# Patient Record
Sex: Female | Born: 1950 | Race: White | Hispanic: No | Marital: Married | Smoking: Never smoker
Health system: Southern US, Community
[De-identification: ages and names within clinical notes are randomized; demographics above are authoritative.]

## PROBLEM LIST (undated history)

## (undated) HISTORY — PX: TUBAL LIGATION: SHX77

## (undated) HISTORY — PX: APPENDECTOMY: SHX54

## (undated) HISTORY — PX: ELBOW SURGERY: SHX618

---

## 2009-06-23 ENCOUNTER — Ambulatory Visit: Payer: Self-pay | Admitting: Unknown Physician Specialty

## 2009-06-29 ENCOUNTER — Ambulatory Visit: Payer: Self-pay | Admitting: Unknown Physician Specialty

## 2010-09-12 ENCOUNTER — Emergency Department: Payer: Self-pay | Admitting: Emergency Medicine

## 2011-05-30 ENCOUNTER — Ambulatory Visit: Payer: Self-pay | Admitting: Family Medicine

## 2014-03-16 ENCOUNTER — Ambulatory Visit: Payer: Self-pay | Admitting: Family Medicine

## 2014-05-09 ENCOUNTER — Ambulatory Visit: Payer: Self-pay | Admitting: Family Medicine

## 2015-04-15 IMAGING — CR DG CHEST 2V
1 series · 2 of 2 positions shown · non-contrast
Comparison: None.

CLINICAL DATA: dry cough since [REDACTED];

EXAM:
CHEST  2 VIEW

[Series 1: pa · 0.17mm/px · 2 of 2 slices shown]
[im 1/2]
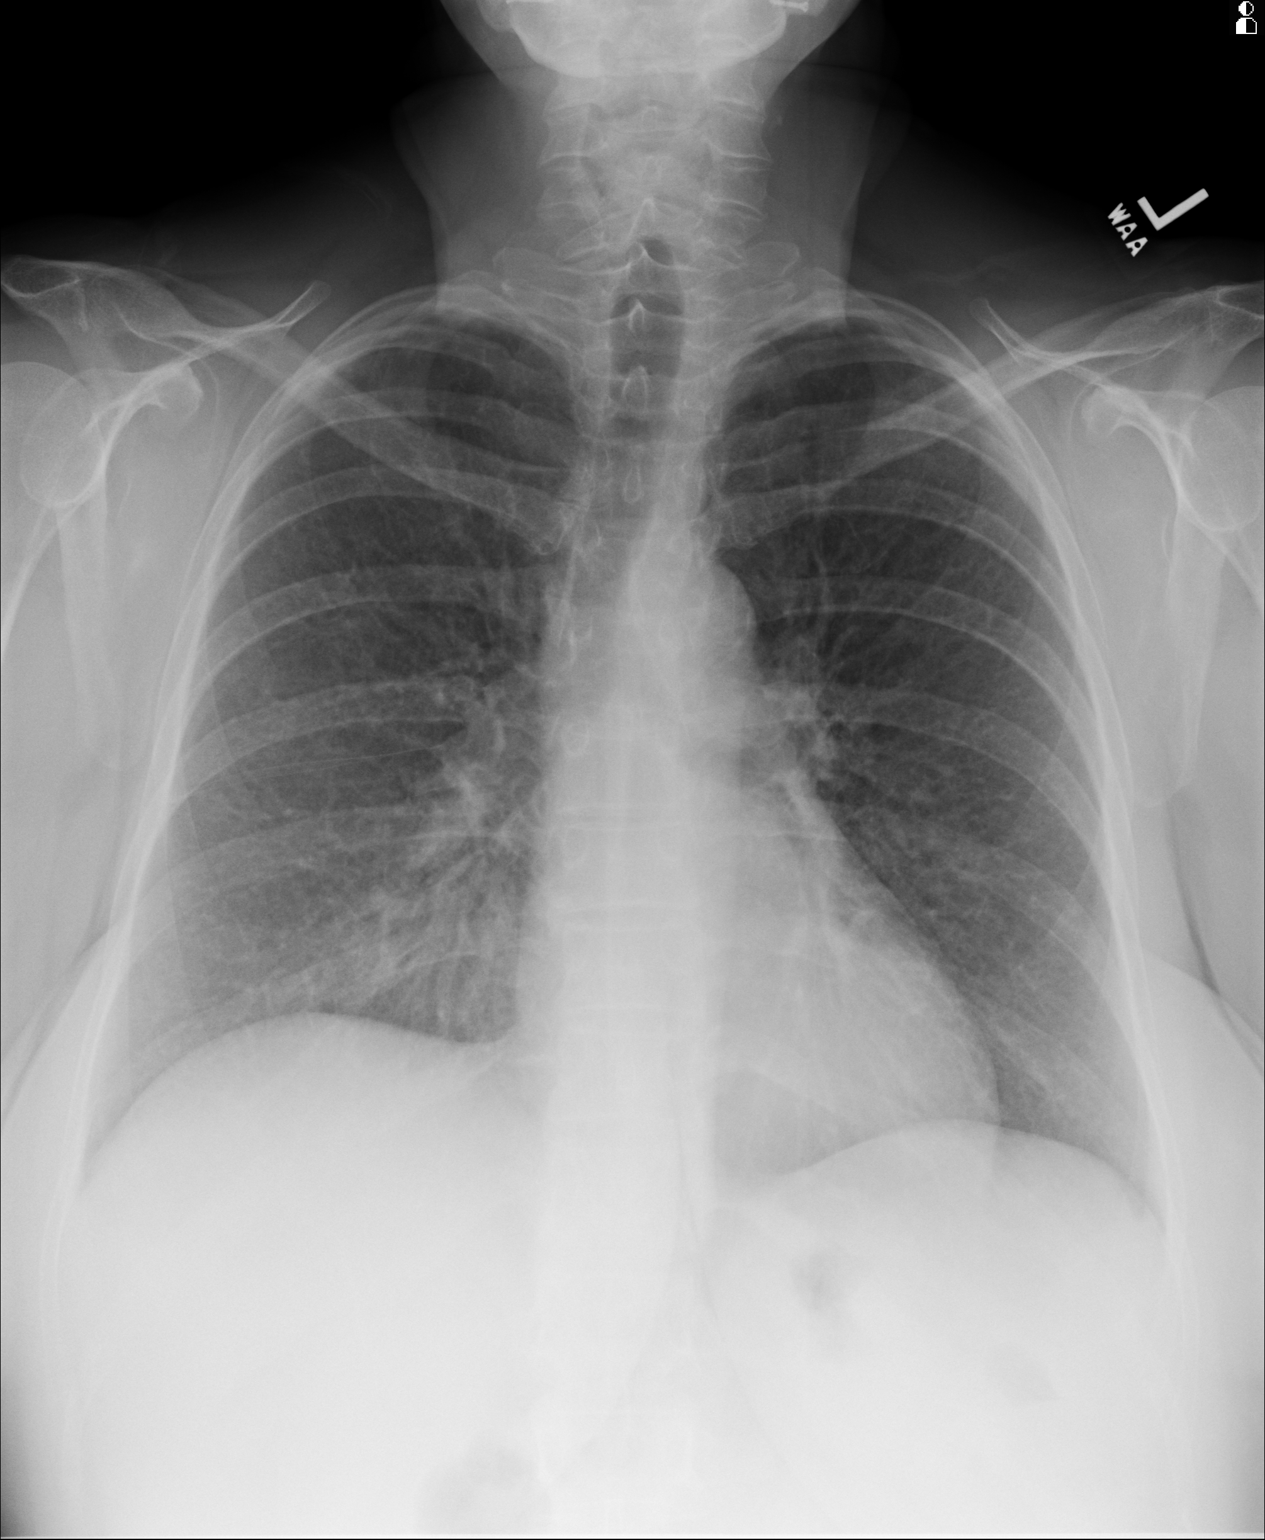
[im 2/2]
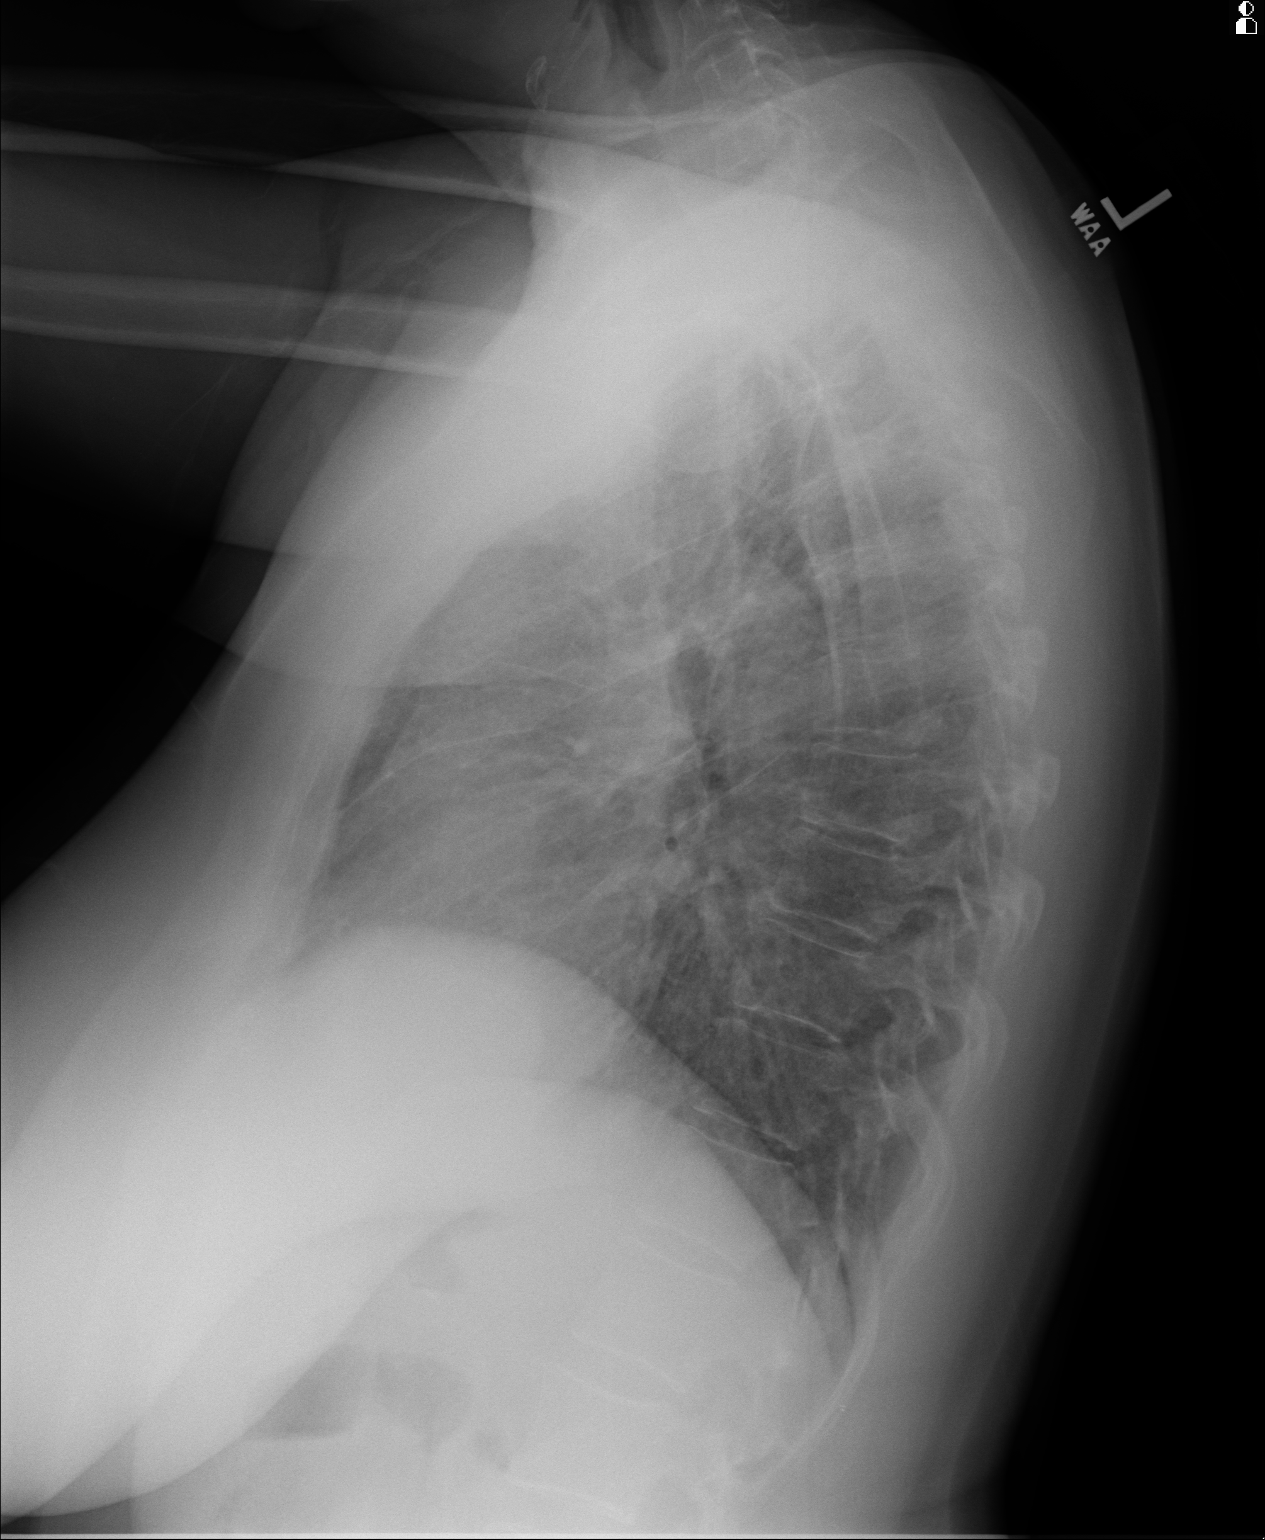

[2 of 2 positions shown; findings below may reference images not displayed]

FINDINGS: The cardiac silhouette within normal limits. Atherosclerotic
calcifications identified within arch of the aorta. No focal regions
of consolidation or focal infiltrates appreciated. There is diffuse
mild prominence of the interstitial markings without evidence of
peribronchial cuffing. No acute osseous abnormalities.
IMPRESSION: Mild interstitial prominence may reflect sequela of an infectious or
inflammatory infiltrate. Repeat surveillance evaluation status post
appropriate therapy regimen is recommended.

## 2015-07-31 ENCOUNTER — Telehealth: Payer: Self-pay | Admitting: Family Medicine

## 2015-07-31 DIAGNOSIS — E039 Hypothyroidism, unspecified: Secondary | ICD-10-CM

## 2015-07-31 NOTE — Telephone Encounter (Signed)
Pt said Dr. Juanetta Gosling changed her thyroid medication and wanted to recheck her lab work.  She would like to come by pick up a lab order.  Please call when ready to pick up [phone]

## 2015-07-31 NOTE — Telephone Encounter (Signed)
Done/JH 

## 2015-08-02 LAB — TSH: TSH: 2.77 u[IU]/mL (ref 0.450–4.500)

## 2015-11-05 ENCOUNTER — Other Ambulatory Visit: Payer: Self-pay | Admitting: Family Medicine

## 2016-06-03 ENCOUNTER — Encounter: Payer: Self-pay | Admitting: Family Medicine

## 2016-06-28 ENCOUNTER — Encounter: Payer: Self-pay | Admitting: Family Medicine

## 2016-07-08 ENCOUNTER — Encounter: Payer: Self-pay | Admitting: Family Medicine

## 2016-07-08 ENCOUNTER — Ambulatory Visit (INDEPENDENT_AMBULATORY_CARE_PROVIDER_SITE_OTHER): Payer: BC Managed Care – PPO | Admitting: Family Medicine

## 2016-07-08 VITALS — BP 115/75 | HR 91 | Temp 97.8°F | Resp 16 | Ht 63.0 in | Wt 167.0 lb

## 2016-07-08 DIAGNOSIS — E038 Other specified hypothyroidism: Secondary | ICD-10-CM

## 2016-07-08 DIAGNOSIS — E034 Atrophy of thyroid (acquired): Secondary | ICD-10-CM

## 2016-07-08 DIAGNOSIS — K219 Gastro-esophageal reflux disease without esophagitis: Secondary | ICD-10-CM | POA: Insufficient documentation

## 2016-07-08 DIAGNOSIS — E039 Hypothyroidism, unspecified: Secondary | ICD-10-CM | POA: Insufficient documentation

## 2016-07-08 MED ORDER — LEVOTHYROXINE SODIUM 50 MCG PO TABS: 50.0000 ug | ORAL_TABLET | Freq: Every day | ORAL | Status: DC

## 2016-07-08 MED ORDER — OMEPRAZOLE 20 MG PO CPDR: 20.0000 mg | DELAYED_RELEASE_CAPSULE | Freq: Every day | ORAL | Status: DC

## 2016-07-08 NOTE — Progress Notes (Signed)
Name: Randall AnVicky V Nieland   MRN: [id number]    DOB: 20-Apr-1951   Date:07/08/2016       Progress Note  Subjective  Chief Complaint  Chief Complaint  Patient presents with  . Annual Exam    HPI Patient will reschedule for Medi care Wellness Exam in August  No problem-specific assessment & plan notes found for this encounter.   History reviewed. No pertinent past medical history.  Past Surgical History  Procedure Laterality Date  . Appendectomy    . Tubal ligation      then untide  . Elbow surgery Left     Family History  Problem Relation Age of Onset  . Heart disease Father   . Kidney disease Father     Social History   Social History  . Marital Status: Married    Spouse Name: N/A  . Number of Children: N/A  . Years of Education: N/A   Occupational History  . Not on file.   Social History Main Topics  . Smoking status: Never Smoker   . Smokeless tobacco: Never Used  . Alcohol Use: No  . Drug Use: No  . Sexual Activity: Not on file   Other Topics Concern  . Not on file   Social History Narrative  . No narrative on file     Current outpatient prescriptions:  .  levothyroxine (SYNTHROID, LEVOTHROID) 50 MCG tablet, TAKE 1 TABLET DAILY, Disp: 90 tablet, Rfl: 1  Allergies  Allergen Reactions  . Bee Venom Swelling  . Flagyl [Metronidazole] Other (See Comments)    Loss of motor skills  . Penicillins   . Tape Hives     Review of Systems  Unable to perform ROS     Objective  Filed Vitals:   07/08/16 1407  BP: 115/75  Pulse: 91  Temp: 97.8 F (36.6 C)  TempSrc: Oral  Resp: 16  Height: 5\' 3"  (1.6 m)  Weight: 167 lb (75.751 kg)    Physical Exam     No results found for this or any previous visit (from the past 2160 hour(s)).   Assessment & Plan  Problem List Items Addressed This Visit      Digestive   GERD (gastroesophageal reflux disease)     Endocrine   Hypothyroidism - Primary      No orders of the defined types were placed  in this encounter.       c

## 2016-07-09 LAB — TSH: TSH: 2.86 m[IU]/L

## 2016-10-16 ENCOUNTER — Encounter: Payer: Self-pay | Admitting: Family Medicine

## 2016-10-16 ENCOUNTER — Ambulatory Visit (INDEPENDENT_AMBULATORY_CARE_PROVIDER_SITE_OTHER): Payer: Medicare Other | Admitting: Family Medicine

## 2016-10-16 ENCOUNTER — Encounter (INDEPENDENT_AMBULATORY_CARE_PROVIDER_SITE_OTHER): Payer: Self-pay

## 2016-10-16 VITALS — BP 134/88 | HR 86 | Temp 97.8°F | Resp 16 | Ht 63.5 in | Wt 172.0 lb

## 2016-10-16 DIAGNOSIS — B372 Candidiasis of skin and nail: Secondary | ICD-10-CM | POA: Diagnosis not present

## 2016-10-16 DIAGNOSIS — L304 Erythema intertrigo: Secondary | ICD-10-CM

## 2016-10-16 MED ORDER — HYDROXYZINE HCL 10 MG PO TABS: 5.0000 mg | ORAL_TABLET | Freq: Three times a day (TID) | ORAL | 0 refills | Status: DC | PRN

## 2016-10-16 MED ORDER — TRIAMCINOLONE ACETONIDE 0.1 % EX OINT: 1.0000 "application " | TOPICAL_OINTMENT | Freq: Two times a day (BID) | CUTANEOUS | 0 refills | Status: DC

## 2016-10-16 MED ORDER — NYSTATIN 100000 UNIT/GM EX POWD: Freq: Three times a day (TID) | CUTANEOUS | 1 refills | Status: DC

## 2016-10-16 NOTE — Assessment & Plan Note (Signed)
Subacute flare x 1 month bilateral under breast skin folds, moderate appearing extensive candidal intertrigo characteristic on exam. Prior similar episodes years ago due to excessive sweating / moisture.  Plan: 1. Start Nystatin powder TID for 2-4 weeks 2. Also given rx Triamcinolone 0.1% ointment 1-2x daily for up to 2 weeks (may use in few days to reduce inflammation if needed) 3. Hydroxyzine 5-10mg  TID PRN itching, stop benadryl 4. Keep dry, open to air some, limit bra, avoid excessive sweating 5. Follow-up 2-3 weeks if not improved, consider alternative anti fungal topical

## 2016-10-16 NOTE — Patient Instructions (Signed)
Thank you for coming in to clinic today.  1. Use Nystatin powder 3 times a day for up to 2 to 4 weeks, if significant improvement after 2 weeks, you can reduce to twice a day for another 1-2 weeks. - Redness may take longer to resolve - Also use Triamcinoline after next few days, 1 to 2 times a day to help reduce inflammation, use up to 2 weeks - Keep dry as best you can, air dry is best, avoid excess walking and situations with sweating for now  If you are not improving, worsening pain redness,  Itching, spreading, then please notify us after 1 week and we can try an alternative form of anti yeast topical, such as a cream, and then still if not improving last resort is an oral pill that will only be if not improving after 2-3 weeks.  Please schedule a follow-up appointment with Dr. Althea CharonKaramalegos in 2 to 3 weeks if Rash not improved  If you have any other questions or concerns, please feel free to call the clinic or send a message through MyChart. You may also schedule an earlier appointment if necessary.  Saralyn PilarAlexander Arthelia Callicott, DO [facility]

## 2016-10-16 NOTE — Progress Notes (Signed)
Subjective:    Patient ID: Natalie Kidd, female    DOB: 1951/05/03, 65 y.o.   MRN: [id number]  Natalie Kidd is a 65 y.o. female presenting on 10/16/2016 for Rash (onset month bothside underneath breast area)  Patient presents for a same day appointment.  HPI   RASH, INTERTRIGO - History of prior rashes under breasts on torso diagnosed as yeast rash in past, last was several years ago, has resolved with topical treatments, but she is unsure which have been used before - Reports current flare started 1 month ago, similar to prior rashes, describes onset due to increased sweating with humidity recently, she walks regularly. Describes red irritated skin worsening under both breasts, symptoms with some burning and now worsening itching, tried Benadryl but makes her too sleepy - Tried OTC Neosporin, Cortizone, Corn starch, baby powder without relief, did not try medicine for yeast infection - Denies any fevers/chills, nausea, vomiting, abdominal pain, drainage of pus, other rash, skin pain  Social History  Substance Use Topics  . Smoking status: Never Smoker  . Smokeless tobacco: Never Used  . Alcohol use No    Review of Systems Per HPI unless specifically indicated above     Objective:    BP 134/88   Pulse 86   Temp 97.8 F (36.6 C) (Oral)   Resp 16   Ht 5' 3.5" (1.613 m)   Wt 172 lb (78 kg)   BMI 29.99 kg/m   Wt Readings from Last 3 Encounters:  10/16/16 172 lb (78 kg)  07/08/16 167 lb (75.8 kg)    Physical Exam  Constitutional: She appears well-developed and well-nourished. No distress.  Well-appearing, comfortable, cooperative  Cardiovascular: Normal rate.   Pulmonary/Chest: Effort normal.  Neurological: She is alert.  Skin: Skin is warm and dry. Rash noted. She is not diaphoretic.  Torso, Under Bilateral Breasts - Significant areas of red macerated tissue isolated to under breasts bilaterally (R worse than Left) with some superficial skin sloughing centrally,  extensive satellite lesions scattered on periphery of well demarcated rash. No evidence of cellulitis or extending erythema, no drainage of pus. Sore but non tender.  No chaperone present during exam as patient remained mostly covered. Patient preference, chaperone was offered.  Nursing note and vitals reviewed.      Assessment & Plan:   Problem List Items Addressed This Visit    Candidal intertrigo - Primary    Subacute flare x 1 month bilateral under breast skin folds, moderate appearing extensive candidal intertrigo characteristic on exam. Prior similar episodes years ago due to excessive sweating / moisture.  Plan: 1. Start Nystatin powder TID for 2-4 weeks 2. Also given rx Triamcinolone 0.1% ointment 1-2x daily for up to 2 weeks (may use in few days to reduce inflammation if needed) 3. Hydroxyzine 5-10mg  TID PRN itching, stop benadryl 4. Keep dry, open to air some, limit bra, avoid excessive sweating 5. Follow-up 2-3 weeks if not improved, consider alternative anti fungal topical      Relevant Medications   nystatin (MYCOSTATIN/NYSTOP) powder   triamcinolone ointment (KENALOG) 0.1 %   hydrOXYzine (ATARAX/VISTARIL) 10 MG tablet    Other Visit Diagnoses   None.     Meds ordered this encounter  Medications  . nystatin (MYCOSTATIN/NYSTOP) powder    Sig: Apply topically 3 (three) times daily. For 2 to 4 weeks until healed.    Dispense:  15 g    Refill:  1  . triamcinolone ointment (KENALOG) 0.1 %  Sig: Apply 1 application topically 2 (two) times daily. For up to 2 weeks    Dispense:  30 g    Refill:  0  . hydrOXYzine (ATARAX/VISTARIL) 10 MG tablet    Sig: Take 0.5-1 tablets (5-10 mg total) by mouth 3 (three) times daily as needed for itching.    Dispense:  20 tablet    Refill:  0      Follow up plan: Return in about 3 weeks (around 11/06/2016), or if symptoms worsen or fail to improve, for rash not improving.  Saralyn Pilar, DO [facility] 10/16/2016, 11:52 AM

## 2017-07-07 ENCOUNTER — Ambulatory Visit (INDEPENDENT_AMBULATORY_CARE_PROVIDER_SITE_OTHER): Payer: BC Managed Care – PPO | Admitting: Nurse Practitioner

## 2017-07-07 ENCOUNTER — Encounter: Payer: Self-pay | Admitting: Nurse Practitioner

## 2017-07-07 VITALS — BP 147/66 | HR 87 | Temp 97.6°F | Resp 16 | Ht 63.0 in | Wt 180.6 lb

## 2017-07-07 DIAGNOSIS — R635 Abnormal weight gain: Secondary | ICD-10-CM | POA: Diagnosis not present

## 2017-07-07 DIAGNOSIS — B372 Candidiasis of skin and nail: Secondary | ICD-10-CM

## 2017-07-07 DIAGNOSIS — E039 Hypothyroidism, unspecified: Secondary | ICD-10-CM

## 2017-07-07 DIAGNOSIS — K219 Gastro-esophageal reflux disease without esophagitis: Secondary | ICD-10-CM | POA: Diagnosis not present

## 2017-07-07 DIAGNOSIS — Z6831 Body mass index (BMI) 31.0-31.9, adult: Secondary | ICD-10-CM | POA: Diagnosis not present

## 2017-07-07 DIAGNOSIS — L918 Other hypertrophic disorders of the skin: Secondary | ICD-10-CM | POA: Diagnosis not present

## 2017-07-07 MED ORDER — LEVOTHYROXINE SODIUM 50 MCG PO TABS: 50.0000 ug | ORAL_TABLET | Freq: Every day | ORAL | 1 refills | Status: DC

## 2017-07-07 MED ORDER — OMEPRAZOLE 20 MG PO CPDR: 20.0000 mg | DELAYED_RELEASE_CAPSULE | Freq: Every day | ORAL | 3 refills | Status: DC

## 2017-07-07 MED ORDER — TRIAMCINOLONE ACETONIDE 0.1 % EX OINT: 1.0000 "application " | TOPICAL_OINTMENT | Freq: Two times a day (BID) | CUTANEOUS | 0 refills | Status: DC

## 2017-07-07 NOTE — Progress Notes (Signed)
Subjective:    Patient ID: Natalie Kidd, female    DOB: 20-Jan-1951, 66 y.o.   MRN: [id number]  TAVI GAUGHRAN is a 66 y.o. female presenting on 07/07/2017 for Hypothyroidism   HPI Hypothyroidism Pt states she is taking her levothyroxine in the am at least 1 hour before eating or drinking and taking other medicines.  She deneis heart racing, heart palpitations, heat intolerance, changes in hair/skin/nails, and lower leg swelling. She does not have any compressive symptoms to include difficulty swallowing, globus sensation, or difficulty breathing when lying flat. - She is noting weight gain despite watching what she eats.  She eats only two "small" meals per day w/ lunch and dinner.   Exercises 30-60 minutes daily 4-5 times per week.  Does 8,000 steps per day w/ fitbit.   GERD Takes medication regularly and notices globus sensation if does not take this. Omeprazole.  Watches what she eats.  Small meals and early meals.  Breast irritation Pt notes recurrent irritation below her pendulous breasts.  She specifically notes difficulty keeping the skin dry and regularly uses cornstarch to absorb the moisture.  W/ irritation, she uses neosporin and triamcinolone and w/ cornstarch, she notes it will eventually heal.  Request for referral to dermatology: - Pt notes a "Knot on head" that has gotten larger and harder to hide. It has never been sore, swollen, or warm to touch.   - Skin tags located near her axilla: visibly unappealing and would like to have removed.  Social History  Substance Use Topics  . Smoking status: Never Smoker  . Smokeless tobacco: Never Used  . Alcohol use No    Review of Systems Per HPI unless specifically indicated above     Objective:    BP (!) 147/66   Pulse 87   Temp 97.6 F (36.4 C) (Oral)   Resp 16   Ht 5\' 3"  (1.6 m)   Wt 180 lb 9.6 oz (81.9 kg)   BMI 31.99 kg/m    Wt Readings from Last 3 Encounters:  07/07/17 180 lb 9.6 oz (81.9 kg)  10/16/16  172 lb (78 kg)  07/08/16 167 lb (75.8 kg)    Physical Exam General - healthy, well-appearing, NAD HEENT - Normocephalic, atraumatic, PERRL, EOMI, patent nares w/o congestion, oropharynx clear, MMM Neck - supple, non-tender, no LAD, mild thyromegaly w/o palpable nodules Heart - RRR, bradycardia, no murmurs heard. Lungs - Clear throughout all lobes, no wheezing, crackles, or rhonchi. Normal work of breathing. Abdomen - soft, NTND, no masses, no hepatosplenomegaly, active bowel sounds. Extremeties - non-tender, no edema, cap refill < 2 seconds, peripheral pulses intact +2 bilaterally Skin - warm, dry, small area about 1 inch of erythema noted under left lateral breast.  Other skin under breasts intact w/o lesions.  Skin tags under left axilla.  Small cyst-like structure on scalp over frontal bone left of midline w/o erythema or periwound edema. Neuro - awake, alert, oriented, intact muscle strength 5/5 bilaterally, intact distal sensation to light touch, normal coordination, normal gait Psych - Normal mood and affect, normal behavior    Results for orders placed or performed in visit on 07/07/17  TSH  Result Value Ref Range   TSH 2.050 0.450 - 4.500 uIU/mL      Assessment & Plan:   Problem List Items Addressed This Visit      Digestive   GERD (gastroesophageal reflux disease)    Symptoms currently well controlled on omeprazole.  Pt does note symptoms  if missed doses.  Plan: 1. Continue omeprazole 20 mg once daily. 2. Follow up 1 year or sooner if needed.      Relevant Medications   omeprazole (PRILOSEC) 20 MG capsule     Endocrine   Hypothyroidism - Primary    Controlled hypothyroidism w/ TSH in normal range and on stable dose of levothyroxine for > 1 year. Pt taking after vitamins and supplements, but has been taking this way for many months.  Aside from weight gain, no other symptoms of hypothyroidism present.  Plan: 1. Continue levothyroxine 50 mcg once daily.  May continue  same routine since TSH stable.  Encouraged taking on empty stomach 1 hr before meals and 2-3 hours before or after supplements. 2. Follow up 1 year.      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    Plan: 1. Reviewed side effects of prolonged steroid cream use.  Cautioned against using for > 14 days per occurrence.  Recommended no ongoing use of powders/corn starch that would promote growth of yeast. 2. Continue triamcinolone as needed for under breast skin lesions. 3. Keep skin clean and dry utilizing cotton cloths or gauze and changing frequently.  May consider using deodorant/antiperspirant product to reduce moisture under breasts. 4. Follow up as needed for non-healing wounds > 2 weeks.      Relevant Medications   triamcinolone ointment (KENALOG) 0.1 %     Other   BMI 31.0-31.9,adult    Pt w/ steady weight gain over time.  Likely imbalance of caloric intake vs expenditure.  Possible calorie underconsumption w/ only two small meals per day.  Plan: 1. Establish baseline calorie intake utilizing myfitnesspal for food log.  Goal is to consume at least 1200 calories per day. 2. Increase physical activity w/ time of elevated HR between 20-30 minutes most days of week. 3. Follow up if needed for nutritional education session in 2-3 months.  Otherwise, follow up 1 year.       Other Visit Diagnoses    Skin tags, multiple acquired     Benign lesions.  Concerning to pt for cosmetic purposes.  Plan: 1. Referral placed to dermatology for evaluation and possible removal.   Relevant Orders   Ambulatory referral to Dermatology   Weight gain     See BMI above      Meds ordered this encounter  Medications  . triamcinolone ointment (KENALOG) 0.1 %    Sig: Apply 1 application  topically 2 (two) times daily. For up to 2 weeks w/ each event.    Dispense:  30 g    Refill:  0    Order Specific Question:   Supervising Provider    Answer:   Smitty CordsKARAMALEGOS, ALEXANDER J [[id number]]  . levothyroxine (SYNTHROID, LEVOTHROID) 50 MCG tablet    Sig: Take 1 tablet (50 mcg total) by mouth daily.    Dispense:  90 tablet    Refill:  1    Order Specific Question:   Supervising Provider    Answer:   Smitty CordsKARAMALEGOS, ALEXANDER J [[id number]]  . omeprazole (PRILOSEC) 20 MG capsule    Sig: Take 1 capsule (20 mg total) by mouth daily.    Dispense:  90 capsule    Refill:  3    Order Specific Question:  Supervising Provider    Answer:   Smitty Cords [[id number]]      Follow up plan: Return in about 1 year (around 07/07/2018) for thyroid, GERD.   [name], DNP, AGPCNP-BC Adult Gerontology Primary Care Nurse Practitioner [facility] 07/09/2017, 7:25 PM

## 2017-07-07 NOTE — Patient Instructions (Addendum)
Maryon, Thank you for coming in to clinic today.  1. Ensure you are actually eating 1200 calories per day. Use MyFitnessPal for about 1-2 weeks to get a baseline.   2. For your thyroid - Go to [facility] today for your TSH - Take your levothyroxine 50 mcg 1 hour before eating or drinking anything other than water.  Wait 2-3 hours before supplements.    3. For your skin: - Continue prevention of rash, use the barrier cream (desitin). - Use triamcinolone cream as needed for rash.  Use for 2 weeks then stop.  4. You also have toenail fungus:  - can use Vick's vapor rub or a burt's bees res-Q ointment daily until your nail grows out.  Please schedule a follow-up appointment with Wilhelmina McardleLauren Nanette Wirsing, AGNP to Return in about 1 year (around 07/07/2018) for thyroid, GERD.  If you have any other questions or concerns, please feel free to call the clinic or send a message through MyChart. You may also schedule an earlier appointment if necessary.  Wilhelmina McardleLauren Lemuel Boodram, DNP, AGNP-BC Adult Gerontology Nurse Practitioner [facility], [facility]

## 2017-07-08 LAB — TSH: TSH: 2.05 u[IU]/mL (ref 0.450–4.500)

## 2017-07-09 DIAGNOSIS — E66811 Obesity, class 1: Secondary | ICD-10-CM | POA: Insufficient documentation

## 2017-07-09 DIAGNOSIS — Z6832 Body mass index (BMI) 32.0-32.9, adult: Secondary | ICD-10-CM | POA: Insufficient documentation

## 2017-07-09 DIAGNOSIS — E669 Obesity, unspecified: Secondary | ICD-10-CM | POA: Insufficient documentation

## 2017-07-09 DIAGNOSIS — Z6834 Body mass index (BMI) 34.0-34.9, adult: Secondary | ICD-10-CM

## 2017-07-09 DIAGNOSIS — E6609 Other obesity due to excess calories: Secondary | ICD-10-CM | POA: Insufficient documentation

## 2017-07-09 NOTE — Progress Notes (Signed)
I have reviewed this encounter including the documentation in this note and/or discussed this patient with the provider, Wilhelmina McardleLauren Kennedy, AGPCNP-BC. I am certifying that I agree with the content of this note as supervising physician.  Saralyn PilarAlexander Karamalegos, DO [facility] 07/09/2017, 9:29 PM

## 2017-07-09 NOTE — Assessment & Plan Note (Signed)
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    Plan: 1. Reviewed side effects of prolonged steroid cream use.  Cautioned against using for > 14 days per occurrence.  Recommended no ongoing use of powders/corn starch that would promote growth of yeast. 2. Continue triamcinolone as needed for under breast skin lesions. 3. Keep skin clean and dry utilizing cotton cloths or gauze and changing frequently.  May consider using deodorant/antiperspirant product to reduce moisture under breasts. 4. Follow up as needed for non-healing wounds > 2 weeks.

## 2017-07-09 NOTE — Assessment & Plan Note (Signed)
Symptoms currently well controlled on omeprazole.  Pt does note symptoms if missed doses.  Plan: 1. Continue omeprazole 20 mg once daily. 2. Follow up 1 year or sooner if needed.

## 2017-07-09 NOTE — Assessment & Plan Note (Signed)
Pt w/ steady weight gain over time.  Likely imbalance of caloric intake vs expenditure.  Possible calorie underconsumption w/ only two small meals per day.  Plan: 1. Establish baseline calorie intake utilizing myfitnesspal for food log.  Goal is to consume at least 1200 calories per day. 2. Increase physical activity w/ time of elevated HR between 20-30 minutes most days of week. 3. Follow up if needed for nutritional education session in 2-3 months.  Otherwise, follow up 1 year.

## 2017-07-09 NOTE — Assessment & Plan Note (Signed)
Controlled hypothyroidism w/ TSH in normal range and on stable dose of levothyroxine for > 1 year. Pt taking after vitamins and supplements, but has been taking this way for many months.  Aside from weight gain, no other symptoms of hypothyroidism present.  Plan: 1. Continue levothyroxine 50 mcg once daily.  May continue same routine since TSH stable.  Encouraged taking on empty stomach 1 hr before meals and 2-3 hours before or after supplements. 2. Follow up 1 year.

## 2017-12-16 ENCOUNTER — Other Ambulatory Visit: Payer: Self-pay

## 2017-12-16 ENCOUNTER — Other Ambulatory Visit: Payer: Self-pay | Admitting: Nurse Practitioner

## 2017-12-16 DIAGNOSIS — E034 Atrophy of thyroid (acquired): Secondary | ICD-10-CM

## 2017-12-17 ENCOUNTER — Encounter: Payer: Self-pay | Admitting: Nurse Practitioner

## 2017-12-17 LAB — TSH: TSH: 2.27 mIU/L (ref 0.40–4.50)

## 2017-12-18 MED ORDER — LEVOTHYROXINE SODIUM 50 MCG PO TABS: 50.0000 ug | ORAL_TABLET | Freq: Every day | ORAL | 3 refills | Status: DC

## 2018-01-06 ENCOUNTER — Ambulatory Visit: Payer: BC Managed Care – PPO | Admitting: Nurse Practitioner

## 2018-01-06 ENCOUNTER — Encounter: Payer: Self-pay | Admitting: Nurse Practitioner

## 2018-01-06 ENCOUNTER — Other Ambulatory Visit: Payer: Self-pay

## 2018-01-06 VITALS — BP 131/59 | HR 79 | Temp 98.1°F | Resp 18 | Ht 63.0 in | Wt 189.4 lb

## 2018-01-06 DIAGNOSIS — J40 Bronchitis, not specified as acute or chronic: Secondary | ICD-10-CM | POA: Diagnosis not present

## 2018-01-06 DIAGNOSIS — J069 Acute upper respiratory infection, unspecified: Secondary | ICD-10-CM

## 2018-01-06 MED ORDER — IPRATROPIUM BROMIDE 0.06 % NA SOLN
2.0000 | Freq: Four times a day (QID) | NASAL | 0 refills | Status: DC
Start: 2018-01-06 — End: 2018-12-08

## 2018-01-06 MED ORDER — ALBUTEROL SULFATE HFA 108 (90 BASE) MCG/ACT IN AERS: 1.0000 | INHALATION_SPRAY | Freq: Four times a day (QID) | RESPIRATORY_TRACT | 5 refills | Status: DC | PRN

## 2018-01-06 MED ORDER — FLUTICASONE PROPIONATE 50 MCG/ACT NA SUSP: 2.0000 | Freq: Every day | NASAL | 6 refills | Status: DC

## 2018-01-06 MED ORDER — BENZONATATE 100 MG PO CAPS: 100.0000 mg | ORAL_CAPSULE | Freq: Two times a day (BID) | ORAL | 0 refills | Status: DC | PRN

## 2018-01-06 NOTE — Patient Instructions (Addendum)
Stellar, Thank you for coming in to clinic today.  1. It sounds like you have a Upper Respiratory Virus - this will most likely run it's course in 7 to 10 days. Recommend good hand washing. - Start Atrovent nasal spray decongestant 2 sprays each nostril up to 4 times daily for 5-7 days - Continue anti-histamine Cetirizine 10mg  daily, also can use Flonase 2 sprays each nostril daily for up to 4-6 weeks - If congestion is worse, start OTC Mucinex (or may try Mucinex-DM for cough) up to 7-10 days then stop - START benzonatate 100 mg twice daily as needed for cough - Drink plenty of fluids to improve congestion - You may try over the counter Nasal Saline spray (Simply Saline, Ocean Spray) as needed to reduce congestion. - Drink warm herbal tea with honey for sore throat. - Start taking Tylenol extra strength 1 to 2 tablets every 6-8 hours for aches or fever/chills for next few days as needed.  Do not take more than 3,000 mg in 24 hours from all medicines.  May take Ibuprofen as well if tolerated 200-400mg  every 8 hours as needed.  If symptoms significantly worsening with persistent fevers/chills despite tylenol/ibpurofen, nausea, vomiting unable to tolerate food/fluids or medicine, body aches, or shortness of breath, sinus pain pressure or worsening productive cough, then follow-up for re-evaluation, may seek more immediate care at Urgent Care or ED if more concerned for emergency.  Please schedule a follow-up appointment with Wilhelmina McardleLauren Marv Alfrey, AGNP. Return 5-7 days if symptoms worsen or fail to improve.  If you have any other questions or concerns, please feel free to call the clinic or send a message through MyChart. You may also schedule an earlier appointment if necessary.  You will receive a survey after today's visit either digitally by e-mail or paper by [employer] mail. Your experiences and feedback matter to us.  Please respond so we know how we are doing as we provide care for you.   Wilhelmina McardleLauren  Teneshia Hedeen, DNP, AGNP-BC Adult Gerontology Nurse Practitioner [facility], [facility]

## 2018-01-06 NOTE — Progress Notes (Signed)
Subjective:    Patient ID: Natalie Kidd, female    DOB: 19-Feb-1951, 67 y.o.   MRN: [id number]  Natalie Kidd is a 67 y.o. female presenting on 01/06/2018 for Cough (productive cough, nasal congestion, chest congestion x 3 days )   HPI Cough Started taking OTC meds (cough and cold) Saturday. Felt a little bit better.  Significant fatigue yesterday. - Regularly has seasonal allergies. - Symptoms onset was 3 days ago and includes nasal congestion, productive cough, chest congestion, sinus pressure, and ear fullness. - Pt denies fever, chills, sweats, nausea, vomiting, diarrhea and constipation.  Social History   Tobacco Use  . Smoking status: Never Smoker  . Smokeless tobacco: Never Used  Substance Use Topics  . Alcohol use: No  . Drug use: No    Review of Systems Per HPI unless specifically indicated above     Objective:    BP (!) 131/59 (BP Location: Left Arm, Patient Position: Sitting, Cuff Size: Normal)   Pulse 79   Temp 98.1 F (36.7 C) (Oral)   Resp 18   Ht 5\' 3"  (1.6 m)   Wt 189 lb 6.4 oz (85.9 kg)   SpO2 100%   BMI 33.55 kg/m   Wt Readings from Last 3 Encounters:  01/06/18 189 lb 6.4 oz (85.9 kg)  07/07/17 180 lb 9.6 oz (81.9 kg)  10/16/16 172 lb (78 kg)    Physical Exam  Constitutional: She appears well-developed and well-nourished. She appears distressed (mildly).  HENT:  Head: Normocephalic and atraumatic.  Right Ear: Hearing, tympanic membrane, external ear and ear canal normal.  Left Ear: Hearing, tympanic membrane, external ear and ear canal normal.  Nose: Mucosal edema and rhinorrhea present. Right sinus exhibits maxillary sinus tenderness and frontal sinus tenderness. Left sinus exhibits maxillary sinus tenderness and frontal sinus tenderness.  Mouth/Throat: Uvula is midline and mucous membranes are normal. Posterior oropharyngeal erythema present. No oropharyngeal exudate or posterior oropharyngeal edema.  Neck: Normal range of motion. Neck  supple.  Cardiovascular: Normal rate, regular rhythm, S1 normal, S2 normal and normal heart sounds.  Pulmonary/Chest: Effort normal. No respiratory distress. She has no decreased breath sounds. She has wheezes (throughout all lobes). She has no rhonchi. She has rales (throughout all lobes).  Lymphadenopathy:    She has no cervical adenopathy.  Neurological: She is alert.  Skin: Skin is warm and dry.  Psychiatric: She has a normal mood and affect. Her behavior is normal.      Assessment & Plan:   Problem List Items Addressed This Visit    None    Visit Diagnoses    Bronchitis    -  Primary Bronchitis secondary to viral URI (see below).  Plan: 1. START benzonatate 100 mg twice daily 2. START albuterol 1-2 puffs q 6 hrs as needed for wheezing and severe coughing.   Relevant Medications   benzonatate (TESSALON) 100 MG capsule   albuterol (PROVENTIL HFA;VENTOLIN HFA) 108 (90 Base) MCG/ACT inhaler   Viral URI     Acute illness. Fever responsive to NSAIDs and tylenol.  Symptoms not worsening. Consistent with viral illness x 3 days with no known sick contacts and no identifiable focal infections of ears, nose, throat.  Plan: 1. Reassurance, likely self-limited with cough lasting up to few weeks - Start Atrovent nasal spray decongestant 2 sprays each nostril up to 4 times daily for 5-7 days - Continue anti-histamine Cetirizine 10mg  daily,  - also can use Flonase 2 sprays each nostril daily for  up to 4-6 weeks - Start Mucinex-DM OTC up to 7-10 days then stop - Start tessalon perles 100 mg every 12 hours as needed for cough. 2. Supportive care with nasal saline, warm herbal tea with honey, 3. Improve hydration 4. Tylenol / Motrin PRN fevers 5. Return criteria given   Relevant Medications   benzonatate (TESSALON) 100 MG capsule   ipratropium (ATROVENT) 0.06 % nasal spray   fluticasone (FLONASE) 50 MCG/ACT nasal spray      Meds ordered this encounter  Medications  . benzonatate  (TESSALON) 100 MG capsule    Sig: Take 1 capsule (100 mg total) by mouth 2 (two) times daily as needed for cough.    Dispense:  20 capsule    Refill:  0    Order Specific Question:   Supervising Provider    Answer:   Smitty Cords [[id number]]  . ipratropium (ATROVENT) 0.06 % nasal spray    Sig: Place 2 sprays into both nostrils 4 (four) times daily.    Dispense:  15 mL    Refill:  0    Order Specific Question:   Supervising Provider    Answer:   Smitty Cords [[id number]]  . fluticasone (FLONASE) 50 MCG/ACT nasal spray    Sig: Place 2 sprays into both nostrils daily.    Dispense:  16 g    Refill:  6    Order Specific Question:   Supervising Provider    Answer:   Smitty Cords [[id number]]  . albuterol (PROVENTIL HFA;VENTOLIN HFA) 108 (90 Base) MCG/ACT inhaler    Sig: Inhale 1-2 puffs into the lungs every 6 (six) hours as needed for wheezing or shortness of breath.    Dispense:  1 Inhaler    Refill:  5    Order Specific Question:   Supervising Provider    Answer:   Smitty Cords [[id number]]    Follow up plan: Return 5-7 days if symptoms worsen or fail to improve.   [name], DNP, AGPCNP-BC Adult Gerontology Primary Care Nurse Practitioner [facility] 01/06/2018, 11:57 AM

## 2018-01-08 ENCOUNTER — Other Ambulatory Visit: Payer: Self-pay

## 2018-01-09 ENCOUNTER — Other Ambulatory Visit: Payer: Self-pay | Admitting: Nurse Practitioner

## 2018-01-09 MED ORDER — ALBUTEROL SULFATE HFA 108 (90 BASE) MCG/ACT IN AERS
1.0000 | INHALATION_SPRAY | Freq: Four times a day (QID) | RESPIRATORY_TRACT | 5 refills | Status: DC | PRN
Start: 2018-01-09 — End: 2018-01-15

## 2018-01-14 ENCOUNTER — Other Ambulatory Visit: Payer: Self-pay

## 2018-01-14 NOTE — Telephone Encounter (Signed)
Pharmacy is requesting that we changing the prescription to preferred alternative. LEVALBUTEROLAERACT, Proairhfaaer, Proairrespiaer.

## 2018-01-15 MED ORDER — ALBUTEROL SULFATE 108 (90 BASE) MCG/ACT IN AEPB: 1.0000 | INHALATION_SPRAY | Freq: Four times a day (QID) | RESPIRATORY_TRACT | 2 refills | Status: DC | PRN

## 2018-03-06 ENCOUNTER — Other Ambulatory Visit: Payer: Self-pay | Admitting: Nurse Practitioner

## 2018-03-06 DIAGNOSIS — J069 Acute upper respiratory infection, unspecified: Secondary | ICD-10-CM

## 2018-11-27 ENCOUNTER — Other Ambulatory Visit: Payer: Self-pay | Admitting: Nurse Practitioner

## 2018-11-27 DIAGNOSIS — E034 Atrophy of thyroid (acquired): Secondary | ICD-10-CM

## 2018-12-08 ENCOUNTER — Encounter: Payer: Self-pay | Admitting: Nurse Practitioner

## 2018-12-08 ENCOUNTER — Ambulatory Visit: Payer: BC Managed Care – PPO | Admitting: Nurse Practitioner

## 2018-12-08 ENCOUNTER — Other Ambulatory Visit: Payer: Self-pay

## 2018-12-08 VITALS — BP 153/76 | HR 70 | Temp 97.9°F | Ht 63.0 in | Wt 193.0 lb

## 2018-12-08 DIAGNOSIS — K219 Gastro-esophageal reflux disease without esophagitis: Secondary | ICD-10-CM

## 2018-12-08 DIAGNOSIS — Z6831 Body mass index (BMI) 31.0-31.9, adult: Secondary | ICD-10-CM | POA: Diagnosis not present

## 2018-12-08 DIAGNOSIS — Z13220 Encounter for screening for lipoid disorders: Secondary | ICD-10-CM

## 2018-12-08 DIAGNOSIS — R0789 Other chest pain: Secondary | ICD-10-CM

## 2018-12-08 DIAGNOSIS — E6609 Other obesity due to excess calories: Secondary | ICD-10-CM

## 2018-12-08 DIAGNOSIS — Z136 Encounter for screening for cardiovascular disorders: Secondary | ICD-10-CM

## 2018-12-08 DIAGNOSIS — Z6834 Body mass index (BMI) 34.0-34.9, adult: Secondary | ICD-10-CM

## 2018-12-08 DIAGNOSIS — E034 Atrophy of thyroid (acquired): Secondary | ICD-10-CM | POA: Diagnosis not present

## 2018-12-08 MED ORDER — LEVOTHYROXINE SODIUM 50 MCG PO TABS: ORAL_TABLET | ORAL | 1 refills | Status: DC

## 2018-12-08 MED ORDER — OMEPRAZOLE 20 MG PO CPDR: 20.0000 mg | DELAYED_RELEASE_CAPSULE | Freq: Two times a day (BID) | ORAL | 3 refills | Status: DC

## 2018-12-08 NOTE — Assessment & Plan Note (Signed)
Controlled hypothyroidism w/ TSH in normal range and on stable dose of levothyroxine for > 3 years.  Aside from weight gain and fatigue, no other symptoms of hypothyroidism present.  Plan: 1. Continue levothyroxine 50 mcg once daily.  May continue same routine since TSH stable.  Encouraged taking on empty stomach 1 hr before meals and 2-3 hours before or after supplements. - Follow-up labs today 2. Follow up 1 year.

## 2018-12-08 NOTE — Progress Notes (Signed)
Subjective:    Patient ID: Natalie Kidd, female    DOB: August 24, 1951, 67 y.o.   MRN: [id number]  Natalie Kidd is a 67 y.o. female presenting on 12/08/2018 for Hypothyroidism and Gastroesophageal Reflux  HPI Hypothyroidism - Pt states she is taking her levothyroxine 50 mcg in the am at least 1 hour before eating or drinking and taking other medicines.   - She is symptomatic with continued fatigue (up at 4a and asleep at 10-11pm) - feels tired with waking.  Is usually restless with sleep (uses Fitbit and rarely has deep sleep > 15 mins). Mild cold intolerance.   - She denies excess energy, weight changes, heart racing, heart palpitations, heat and cold intolerance, changes in hair/skin/nails, constipation/diarrhea, and lower leg swelling.  - She does not have any compressive symptoms to include difficulty swallowing, globus sensation, or difficulty breathing when lying flat.   GERD Patient is regularly taking omeprazole 20 mg once daily for reflux and has moderate control of symptoms.  Is now increased to 2 tabs daily as she has had worsening reflux.  Elevated BP reading No hypertension diagnosis in past. Patient admits she does have more responsibility/task organization right now with family living with her.     BP Readings from Last 5 Encounters:  12/08/18 (!) 153/76  01/06/18 (!) 131/59  07/07/17 (!) 147/66  10/16/16 134/88  07/08/16 115/75   Chest Tightness Is having some tightening in chest that lasts for a for seconds to 1 or 2 minutes, resolves and occurs only intermittently.  This was occurring before family moved in.  Breathing is not affected, but patient notes she is concentrating on breathing to help relieve stress.  Has not occurred for a couple of weeks. Also uses her inhaler some during these episodes and has improvement. - Has also had some mild shortness of breath with exertion.   - Grew up around smokers, but none in about 35 years.  Never smoker personally and no  workplace environmental risks. - Admits she "gets bronchitis easy."  Social History   Tobacco Use  . Smoking status: Never Smoker  . Smokeless tobacco: Never Used  Substance Use Topics  . Alcohol use: No  . Drug use: No    Review of Systems Per HPI unless specifically indicated above     Objective:    BP (!) 145/75 (BP Location: Right Arm, Patient Position: Sitting, Cuff Size: Normal)   Pulse 70   Temp 97.9 F (36.6 C) (Oral)   Ht 5' 3"  (1.6 m)   Wt 193 lb (87.5 kg)   BMI 34.19 kg/m   Wt Readings from Last 3 Encounters:  12/08/18 193 lb (87.5 kg)  01/06/18 189 lb 6.4 oz (85.9 kg)  07/07/17 180 lb 9.6 oz (81.9 kg)    Physical Exam  Constitutional: She is oriented to person, place, and time. She appears well-developed and well-nourished. No distress.  HENT:  Head: Normocephalic and atraumatic.  Neck: Normal range of motion. Neck supple. Carotid bruit is not present.  Cardiovascular: Normal rate, regular rhythm, S1 normal, S2 normal, normal heart sounds and intact distal pulses. Exam reveals no gallop and no friction rub.  No murmur heard. Pulmonary/Chest: Effort normal and breath sounds normal. No stridor. No respiratory distress. She has no wheezes. She has no rales. She exhibits no tenderness.  Abdominal: Soft. Bowel sounds are normal. She exhibits no distension. There is no tenderness. There is no guarding.  Musculoskeletal: She exhibits no edema (pedal).  Neurological:  She is alert and oriented to person, place, and time.  Skin: Skin is warm and dry. Capillary refill takes less than 2 seconds.  Psychiatric: She has a normal mood and affect. Her behavior is normal. Judgment and thought content normal.  Vitals reviewed.    Results for orders placed or performed in visit on 12/16/17  TSH  Result Value Ref Range   TSH 2.27 0.40 - 4.50 mIU/L   12/08/18 EKG: Normal Sinus Rhythm  HR 71bpm PR 150m QT 4411m  QTc 463 ms QRS 8632m    Assessment & Plan:   Problem  List Items Addressed This Visit      Digestive   GERD (gastroesophageal reflux disease) - Primary    Symptoms currently well controlled on omeprazole 20 mg bid only.  Patient had taken higher dose in past and increased to help with symptoms between lst visit.  Pt notes significant symptoms after meals if missed doses.  Plan: 1. Continue omeprazole 20 mg twice daily before meals. 2. Follow up 1 year or sooner if needed.      Relevant Medications   omeprazole (PRILOSEC) 20 MG capsule   Other Relevant Orders   CBC with Differential/Platelet     Endocrine   Hypothyroidism    Controlled hypothyroidism w/ TSH in normal range and on Kidd dose of levothyroxine for > 3 years.  Aside from weight gain and fatigue, no other symptoms of hypothyroidism present.  Plan: 1. Continue levothyroxine 50 mcg once daily.  May continue same routine since TSH Kidd.  Encouraged taking on empty stomach 1 hr before meals and 2-3 hours before or after supplements. - Follow-up labs today 2. Follow up 1 year.      Relevant Medications   levothyroxine (SYNTHROID, LEVOTHROID) 50 MCG tablet   Other Relevant Orders   COMPLETE METABOLIC PANEL WITH GFR   TSH   Lipid panel     Other   Class 1 obesity with serious comorbidity and body mass index (BMI) of 34.0 to 34.9 in adult    Increasing and unstable.  Patient with comorbid hypertension. - Screen lipid and metabolic abnormalities. - Encouraged healthy lifestyle and work toward weight loss. - Follow-up 1 year.       Other Visit Diagnoses    Encounter for lipid screening for cardiovascular disease     Patient needs lipid screening.  Labs today.  Follow-up after prn.   Relevant Orders   Lipid panel   Chest tightness     EKG 12-lead in clinic normal and without signs of ischemia. Symptoms are consistent with respiratory cause, but cannot exclude cardiac causes.  Plan: 1. EKG in clinic today - normal and not indicative of ischemia. - Consider future  echo or cardiac stress test if needed with referral to Cardiology. 2. PFT ordered today. 3. Follow-up prn if recurrent symptoms or not resolving with rest.   Relevant Orders   EKG 12-Lead   Pulmonary Function Test [facility] Only      Meds ordered this encounter  Medications  . omeprazole (PRILOSEC) 20 MG capsule    Sig: Take 1 capsule (20 mg total) by mouth 2 (two) times daily before a meal.    Dispense:  180 capsule    Refill:  3    Order Specific Question:   Supervising Provider    Answer:   KAROlin [id number]]  . levothyroxine (SYNTHROID, LEVOTHROID) 50 MCG tablet    Sig: TAKE 1 TABLET(50 MCG) BY MOUTH DAILY  Dispense:  90 tablet    Refill:  1    Order Specific Question:   Supervising Provider    Answer:   Olin Hauser [[id number]]    Follow up plan: Return in about 3 months (around 03/09/2019) for hypertension.  Cassell Smiles, DNP, AGPCNP-BC Adult Gerontology Primary Care Nurse Practitioner [facility] 12/08/2018, 8:10 AM

## 2018-12-08 NOTE — Assessment & Plan Note (Addendum)
Increasing and unstable.  Patient with comorbid hypertension. - Screen lipid and metabolic abnormalities. - Encouraged healthy lifestyle and work toward weight loss. - Follow-up 1 year.

## 2018-12-08 NOTE — Patient Instructions (Addendum)
Natalie Kidd,   Thank you for coming in to clinic today.  1. Check BP outside clinic several times over the next 3-4 weeks (2 readings per week). GOAL is less than 130/80 - If over goals 40% of the time, I recommend considering medication for managing blood pressure.  2. You will be due for FASTING BLOOD WORK.  This means you should eat no food or drink after midnight.  Drink only water or coffee without cream/sugar on the morning of your lab visit. - Please go ahead and schedule a "Lab Only" visit in the morning at the clinic for lab draw in the next 7 days. - Your results will be available about 2-3 days after blood draw.  If you have set up a MyChart account, you can can log in to MyChart online to view your results and a brief explanation. Also, we can discuss your results together at your next office visit if you would like.  3. Continue omeprazole at 20 mg twice daily for reflux.  4. Continue levothyroxine 50 mcg once daily until after labs.    5. [facility] will call to schedule your lung testing.  Please schedule a follow-up appointment with Natalie Kidd, AGNP. Return in about 3 months (around 03/09/2019) for hypertension.  If you have any other questions or concerns, please feel free to call the clinic or send a message through MyChart. You may also schedule Kidd earlier appointment if necessary.  You will receive a survey after today's visit either digitally by e-mail or paper by [employer]. Your experiences and feedback matter to [place].  Please respond so we know how we are doing as we provide care for you.   Natalie Mcardle, DNP, AGNP-BC Adult Gerontology Nurse Practitioner [facility], [facility]   Managing Your Hypertension Hypertension is commonly called high blood pressure. This is when the force of your blood pressing against the walls of your arteries is too strong. Arteries are blood vessels that carry blood from your heart throughout your  body. Hypertension forces the heart to work harder to pump blood, and may cause the arteries to become narrow or stiff. Having untreated or uncontrolled hypertension can cause heart attack, stroke, kidney disease, and other problems. What are blood pressure readings? A blood pressure reading consists of a higher number over a lower number. Ideally, your blood pressure should be below 120/80. The first ("top") number is called the systolic pressure. It is a measure of the pressure in your arteries as your heart beats. The second ("bottom") number is called the diastolic pressure. It is a measure of the pressure in your arteries as the heart relaxes. What does my blood pressure reading mean? Blood pressure is classified into four stages. Based on your blood pressure reading, your health care provider may use the following stages to determine what type of treatment you need, if any. Systolic pressure and diastolic pressure are measured in a unit called mm Hg. Normal  Systolic pressure: below 120.  Diastolic pressure: below 80. Elevated  Systolic pressure: 120-129.  Diastolic pressure: below 80. Hypertension stage 1  Systolic pressure: 130-139.  Diastolic pressure: 80-89. Hypertension stage 2  Systolic pressure: 140 or above.  Diastolic pressure: 90 or above. What health risks are associated with hypertension? Managing your hypertension is Kidd important responsibility. Uncontrolled hypertension can lead to:  A heart attack.  A stroke.  A weakened blood vessel (aneurysm).  Heart failure.  Kidney damage.  Eye damage.  Metabolic  syndrome.  Memory and concentration problems.  What changes can I make to manage my hypertension? Hypertension can be managed by making lifestyle changes and possibly by taking medicines. Your health care provider will help you make a plan to bring your blood pressure within a normal range. Eating and drinking  Eat a diet that is high in fiber and  potassium, and low in salt (sodium), added sugar, and fat. Kidd example eating plan is called the DASH (Dietary Approaches to Stop Hypertension) diet. To eat this way: ? Eat plenty of fresh fruits and vegetables. Try to fill half of your plate at each meal with fruits and vegetables. ? Eat whole grains, such as whole wheat pasta, brown rice, or whole grain bread. Fill about one quarter of your plate with whole grains. ? Eat low-fat diary products. ? Avoid fatty cuts of meat, processed or cured meats, and poultry with skin. Fill about one quarter of your plate with lean proteins such as fish, chicken without skin, beans, eggs, and tofu. ? Avoid premade and processed foods. These tend to be higher in sodium, added sugar, and fat.  Reduce your daily sodium intake. Most people with hypertension should eat less than 1,500 mg of sodium a day.  Limit alcohol intake to no more than 1 drink a day for nonpregnant women and 2 drinks a day for men. One drink equals 12 oz of beer, 5 oz of wine, or 1 oz of hard liquor. Lifestyle  Work with your health care provider to maintain a healthy body weight, or to lose weight. Ask what Kidd ideal weight is for you.  Get at least 30 minutes of exercise that causes your heart to beat faster (aerobic exercise) most days of the week. Activities may include walking, swimming, or biking.  Include exercise to strengthen your muscles (resistance exercise), such as weight lifting, as part of your weekly exercise routine. Try to do these types of exercises for 30 minutes at least 3 days a week.  Do not use any products that contain nicotine or tobacco, such as cigarettes and e-cigarettes. If you need help quitting, ask your health care provider.  Control any long-term (chronic) conditions you have, such as high cholesterol or diabetes. Monitoring  Monitor your blood pressure at home as told by your health care provider. Your personal target blood pressure may vary depending on  your medical conditions, your age, and other factors.  Have your blood pressure checked regularly, as often as told by your health care provider. Working with your health care provider  Review all the medicines you take with your health care provider because there may be side effects or interactions.  Talk with your health care provider about your diet, exercise habits, and other lifestyle factors that may be contributing to hypertension.  Visit your health care provider regularly. Your health care provider can help you create and adjust your plan for managing hypertension. Will I need medicine to control my blood pressure? Your health care provider may prescribe medicine if lifestyle changes are not enough to get your blood pressure under control, and if:  Your systolic blood pressure is 130 or higher.  Your diastolic blood pressure is 80 or higher.  Take medicines only as told by your health care provider. Follow the directions carefully. Blood pressure medicines must be taken as prescribed. The medicine does not work as well when you skip doses. Skipping doses also puts you at risk for problems. Contact a health care provider if:  You think you are having a reaction to medicines you have taken.  You have repeated (recurrent) headaches.  You feel dizzy.  You have swelling in your ankles.  You have trouble with your vision. Get help right away if:  You develop a severe headache or confusion.  You have unusual weakness or numbness, or you feel faint.  You have severe pain in your chest or abdomen.  You vomit repeatedly.  You have trouble breathing. Summary  Hypertension is when the force of blood pumping through your arteries is too strong. If this condition is not controlled, it may put you at risk for serious complications.  Your personal target blood pressure may vary depending on your medical conditions, your age, and other factors. For most people, a normal blood  pressure is less than 120/80.  Hypertension is managed by lifestyle changes, medicines, or both. Lifestyle changes include weight loss, eating a healthy, low-sodium diet, exercising more, and limiting alcohol. This information is not intended to replace advice given to you by your health care provider. Make sure you discuss any questions you have with your health care provider. Document Released: 09/09/2012 Document Revised: 11/13/2016 Document Reviewed: 11/13/2016 Elsevier Interactive Patient Education  [employer]2018 [employer] Inc[employer]

## 2018-12-08 NOTE — Assessment & Plan Note (Signed)
Symptoms currently well controlled on omeprazole 20 mg bid only.  Patient had taken higher dose in past and increased to help with symptoms between lst visit.  Pt notes significant symptoms after meals if missed doses.  Plan: 1. Continue omeprazole 20 mg twice daily before meals. 2. Follow up 1 year or sooner if needed.

## 2018-12-09 ENCOUNTER — Encounter: Payer: Self-pay | Admitting: Nurse Practitioner

## 2018-12-09 LAB — COMPLETE METABOLIC PANEL WITH GFR
AG Ratio: 1.7 (calc) (ref 1.0–2.5)
ALT: 45 U/L — ABNORMAL HIGH (ref 6–29)
AST: 34 U/L (ref 10–35)
Albumin: 4.7 g/dL (ref 3.6–5.1)
Alkaline phosphatase (APISO): 74 U/L (ref 33–130)
BUN/Creatinine Ratio: 19 (calc) (ref 6–22)
BUN: 19 mg/dL (ref 7–25)
CO2: 27 mmol/L (ref 20–32)
Calcium: 9.8 mg/dL (ref 8.6–10.4)
Chloride: 104 mmol/L (ref 98–110)
Creat: 1 mg/dL — ABNORMAL HIGH (ref 0.50–0.99)
GFR, Est African American: 68 mL/min/{1.73_m2} (ref >=60)
GFR, Est Non African American: 58 mL/min/{1.73_m2} — ABNORMAL LOW (ref >=60)
Globulin: 2.8 g/dL (calc) (ref 1.9–3.7)
Glucose, Bld: 96 mg/dL (ref 65–139)
Potassium: 4 mmol/L (ref 3.5–5.3)
Sodium: 141 mmol/L (ref 135–146)
Total Bilirubin: 0.5 mg/dL (ref 0.2–1.2)
Total Protein: 7.5 g/dL (ref 6.1–8.1)

## 2018-12-09 LAB — LIPID PANEL
Cholesterol: 203 mg/dL — ABNORMAL HIGH (ref <200)
HDL: 49 mg/dL — ABNORMAL LOW (ref >50)
LDL Cholesterol (Calc): 128 mg/dL (calc) — ABNORMAL HIGH
Non-HDL Cholesterol (Calc): 154 mg/dL (calc) — ABNORMAL HIGH (ref <130)
Total CHOL/HDL Ratio: 4.1 (calc) (ref <5.0)
Triglycerides: 138 mg/dL (ref <150)

## 2018-12-09 LAB — CBC WITH DIFFERENTIAL/PLATELET
Basophils Absolute: 38 cells/uL (ref 0–200)
Basophils Relative: 0.5 %
Eosinophils Absolute: 158 cells/uL (ref 15–500)
Eosinophils Relative: 2.1 %
HCT: 44.1 % (ref 35.0–45.0)
Hemoglobin: 14.8 g/dL (ref 11.7–15.5)
Lymphs Abs: 2543 cells/uL (ref 850–3900)
MCH: 28.9 pg (ref 27.0–33.0)
MCHC: 33.6 g/dL (ref 32.0–36.0)
MCV: 86.1 fL (ref 80.0–100.0)
MPV: 10.2 fL (ref 7.5–12.5)
Monocytes Relative: 8.6 %
Neutro Abs: 4118 cells/uL (ref 1500–7800)
Neutrophils Relative %: 54.9 %
Platelets: 351 10*3/uL (ref 140–400)
RBC: 5.12 10*6/uL — ABNORMAL HIGH (ref 3.80–5.10)
RDW: 13.3 % (ref 11.0–15.0)
Total Lymphocyte: 33.9 %
WBC mixed population: 645 cells/uL (ref 200–950)
WBC: 7.5 10*3/uL (ref 3.8–10.8)

## 2018-12-09 LAB — TSH: TSH: 1.92 mIU/L (ref 0.40–4.50)

## 2018-12-11 ENCOUNTER — Telehealth: Payer: Self-pay

## 2018-12-11 NOTE — Telephone Encounter (Signed)
This is for PFTs at [facility].

## 2018-12-11 NOTE — Telephone Encounter (Signed)
Pt had questions about her Pulmonary referral. I don't see where a referral been placed.

## 2018-12-24 ENCOUNTER — Ambulatory Visit: Payer: BC Managed Care – PPO | Attending: Nurse Practitioner

## 2018-12-24 DIAGNOSIS — R0789 Other chest pain: Secondary | ICD-10-CM | POA: Diagnosis not present

## 2018-12-24 MED ORDER — ALBUTEROL SULFATE (2.5 MG/3ML) 0.083% IN NEBU
2.5000 mg | INHALATION_SOLUTION | Freq: Once | RESPIRATORY_TRACT | Status: AC
  Administered 2018-12-24: 2.5 mg via RESPIRATORY_TRACT
  Filled 2018-12-24: qty 3

## 2019-08-22 ENCOUNTER — Other Ambulatory Visit: Payer: Self-pay | Admitting: Nurse Practitioner

## 2019-08-22 DIAGNOSIS — E034 Atrophy of thyroid (acquired): Secondary | ICD-10-CM

## 2019-08-23 ENCOUNTER — Other Ambulatory Visit: Payer: Self-pay

## 2019-08-23 ENCOUNTER — Ambulatory Visit (INDEPENDENT_AMBULATORY_CARE_PROVIDER_SITE_OTHER): Payer: BC Managed Care – PPO | Admitting: Nurse Practitioner

## 2019-08-23 ENCOUNTER — Encounter: Payer: Self-pay | Admitting: Nurse Practitioner

## 2019-08-23 DIAGNOSIS — J4520 Mild intermittent asthma, uncomplicated: Secondary | ICD-10-CM

## 2019-08-23 DIAGNOSIS — E034 Atrophy of thyroid (acquired): Secondary | ICD-10-CM

## 2019-08-23 DIAGNOSIS — K219 Gastro-esophageal reflux disease without esophagitis: Secondary | ICD-10-CM | POA: Diagnosis not present

## 2019-08-23 NOTE — Progress Notes (Signed)
Telemedicine Encounter: Disclosed to patient at start of encounter that we will provide appropriate telemedicine services.  Patient consents to be treated via phone prior to discussion. - Patient is at her home and is accessed via telephone. - Services are provided by [name] from [facility].  Subjective:    Patient ID: Natalie Kidd, female    DOB: January 02, 1951, 68 y.o.   MRN: [id number]  Natalie Kidd is a 68 y.o. female presenting on 08/23/2019 for Hypothyroidism and Gastroesophageal Reflux  HPI Hypothyroidism - Pt states she is taking her levothyroxine in the am at least 1 hour before eating or drinking and taking other medicines.   - She is not currently symptomatic. - She denies fatigue, excess energy, weight changes, heart racing, heart palpitations, heat and cold intolerance, changes in hair/skin/nails, and lower leg swelling.  Is still exercising and walking - She does not have any compressive symptoms to include difficulty swallowing, globus sensation, or difficulty breathing when lying flat.  GERD - She is not currently symptomatic.  - Symptoms started 12 months ago. This has been associated with no other symptoms.   - She has been taking omeprazole 20 mg bid with relief - She reports no n/v, coffee ground emesis, dark/black/tarry stool, BRBPR, or other GI bleeding.    Shortness of Breath Has been 3 years since refilled.  Patient usually is able to rest and recover.  Usually uses albuterol only due to allergy.  Asthma/allergy.  Social History   Tobacco Use  . Smoking status: Never Smoker  . Smokeless tobacco: Never Used  Substance Use Topics  . Alcohol use: No  . Drug use: No    Review of Systems Per HPI unless specifically indicated above     Objective:    There were no vitals taken for this visit.  Wt Readings from Last 3 Encounters:  12/08/18 193 lb (87.5 kg)  01/06/18 189 lb 6.4 oz (85.9 kg)  07/07/17 180 lb 9.6 oz (81.9  kg)    Physical Exam Patient remotely monitored.  Verbal communication appropriate.  Cognition normal.  Results for orders placed or performed in visit on 12/08/18  COMPLETE METABOLIC PANEL WITH GFR  Result Value Ref Range   Glucose, Bld 96 65 - 139 mg/dL   BUN 19 7 - 25 mg/dL   Creat 3.29 (H) 5.18 - 0.99 mg/dL   GFR, Est Non African American 58 (L) > OR = 60 mL/min/1.73m2   GFR, Est African American 68 > OR = 60 mL/min/1.7m2   BUN/Creatinine Ratio 19 6 - 22 (calc)   Sodium 141 135 - 146 mmol/L   Potassium 4.0 3.5 - 5.3 mmol/L   Chloride 104 98 - 110 mmol/L   CO2 27 20 - 32 mmol/L   Calcium 9.8 8.6 - 10.4 mg/dL   Total Protein 7.5 6.1 - 8.1 g/dL   Albumin 4.7 3.6 - 5.1 g/dL   Globulin 2.8 1.9 - 3.7 g/dL (calc)   AG Ratio 1.7 1.0 - 2.5 (calc)   Total Bilirubin 0.5 0.2 - 1.2 mg/dL   Alkaline phosphatase (APISO) 74 33 - 130 U/L   AST 34 10 - 35 U/L   ALT 45 (H) 6 - 29 U/L  TSH  Result Value Ref Range   TSH 1.92 0.40 - 4.50 mIU/L  CBC with Differential/Platelet  Result Value Ref Range   WBC 7.5 3.8 - 10.8 Thousand/uL   RBC 5.12 (H) 3.80 - 5.10 Million/uL   Hemoglobin  14.8 11.7 - 15.5 g/dL   HCT 91.4 78.2 - 95.6 %   MCV 86.1 80.0 - 100.0 fL   MCH 28.9 27.0 - 33.0 pg   MCHC 33.6 32.0 - 36.0 g/dL   RDW 21.3 08.6 - 57.8 %   Platelets 351 140 - 400 Thousand/uL   MPV 10.2 7.5 - 12.5 fL   Neutro Abs 4,118 1,500 - 7,800 cells/uL   Lymphs Abs 2,543 850 - 3,900 cells/uL   WBC mixed population 645 200 - 950 cells/uL   Eosinophils Absolute 158 15 - 500 cells/uL   Basophils Absolute 38 0 - 200 cells/uL   Neutrophils Relative % 54.9 %   Total Lymphocyte 33.9 %   Monocytes Relative 8.6 %   Eosinophils Relative 2.1 %   Basophils Relative 0.5 %  Lipid panel  Result Value Ref Range   Cholesterol 203 (H) <200 mg/dL   HDL 49 (L) >46 mg/dL   Triglycerides 962 <952 mg/dL   LDL Cholesterol (Calc) 128 (H) mg/dL (calc)   Total CHOL/HDL Ratio 4.1 <5.0 (calc)   Non-HDL Cholesterol (Calc)  154 (H) <130 mg/dL (calc)      Assessment & Plan:   Problem List Items Addressed This Visit      Digestive   GERD (gastroesophageal reflux disease) Currently well controlled on omeprazole 20 mg bid.  No current complications.  Plan: 1. Continue omeprazole 20 mg once daily. Side effects discussed. Pt wants to continue med. 2. Avoid diet triggers. Reviewed need to seek care if globus sensation, difficulty swallowing, s/sx of GI bleed. 3. Follow up as needed and in 6 months.    Relevant Medications   omeprazole (PRILOSEC) 20 MG capsule   Other Relevant Orders   CBC with Differential/Platelet (Completed)     Endocrine   Hypothyroidism Clinically stable today on exam.  Medications tolerated without side effects.  Continue at current doses.  Refills will be provided after labs.  Check labs today. Followup 6 months.    Relevant Orders   TSH (Completed)   T4, free (Completed)    Other Visit Diagnoses    Mild intermittent asthma without complication    -  Primary Stable today on exam.  Medications tolerated without side effects.  Continue at current doses.  Refills provided.  Keep albuterol inhaler in date. Followup 12 months.    Relevant Medications   Albuterol Sulfate (PROAIR RESPICLICK) 108 (90 Base) MCG/ACT AEPB      Meds ordered this encounter  Medications  . Albuterol Sulfate (PROAIR RESPICLICK) 108 (90 Base) MCG/ACT AEPB    Sig: Inhale 1-2 puffs into the lungs every 6 (six) hours as needed (Wheezing and shortness of breath).    Dispense:  1 each    Refill:  2    Dispense ProAir or other per pt insurance preference    Order Specific Question:   Supervising Provider    Answer:   Smitty Cords [[id number]]  . omeprazole (PRILOSEC) 20 MG capsule    Sig: Take 1 capsule (20 mg total) by mouth 2 (two) times daily before a meal.    Dispense:  180 capsule    Refill:  3    Order Specific Question:   Supervising Provider    Answer:   Smitty Cords [[id number]]    -  Time spent in direct consultation with patient via telemedicine about above concerns: 9 minutes  Follow up plan: Follow-up 6 months    [name], DNP, AGPCNP-BC Adult Gerontology Primary Care Nurse  Practitioner [facility] 08/23/2019, 1:26 PM

## 2019-08-24 MED ORDER — PROAIR RESPICLICK 108 (90 BASE) MCG/ACT IN AEPB: 1.0000 | INHALATION_SPRAY | Freq: Four times a day (QID) | RESPIRATORY_TRACT | 2 refills | Status: DC | PRN

## 2019-08-24 MED ORDER — OMEPRAZOLE 20 MG PO CPDR: 20.0000 mg | DELAYED_RELEASE_CAPSULE | Freq: Two times a day (BID) | ORAL | 3 refills | Status: DC

## 2019-08-25 ENCOUNTER — Other Ambulatory Visit: Payer: Self-pay | Admitting: Nurse Practitioner

## 2019-08-25 DIAGNOSIS — E034 Atrophy of thyroid (acquired): Secondary | ICD-10-CM

## 2019-08-25 LAB — CBC WITH DIFFERENTIAL/PLATELET
Absolute Monocytes: 586 cells/uL (ref 200–950)
Basophils Absolute: 38 cells/uL (ref 0–200)
Basophils Relative: 0.6 %
Eosinophils Absolute: 202 cells/uL (ref 15–500)
Eosinophils Relative: 3.2 %
HCT: 45.6 % — ABNORMAL HIGH (ref 35.0–45.0)
Hemoglobin: 15 g/dL (ref 11.7–15.5)
Lymphs Abs: 2066 cells/uL (ref 850–3900)
MCH: 28.9 pg (ref 27.0–33.0)
MCHC: 32.9 g/dL (ref 32.0–36.0)
MCV: 87.9 fL (ref 80.0–100.0)
MPV: 9.9 fL (ref 7.5–12.5)
Monocytes Relative: 9.3 %
Neutro Abs: 3408 cells/uL (ref 1500–7800)
Neutrophils Relative %: 54.1 %
Platelets: 281 10*3/uL (ref 140–400)
RBC: 5.19 10*6/uL — ABNORMAL HIGH (ref 3.80–5.10)
RDW: 13.2 % (ref 11.0–15.0)
Total Lymphocyte: 32.8 %
WBC: 6.3 10*3/uL (ref 3.8–10.8)

## 2019-08-25 LAB — TSH: TSH: 2.43 mIU/L (ref 0.40–4.50)

## 2019-08-25 LAB — T4, FREE: Free T4: 1.1 ng/dL (ref 0.8–1.8)

## 2019-08-25 MED ORDER — LEVOTHYROXINE SODIUM 50 MCG PO TABS: ORAL_TABLET | ORAL | 1 refills | Status: DC

## 2019-08-29 ENCOUNTER — Encounter: Payer: Self-pay | Admitting: Nurse Practitioner

## 2020-02-04 ENCOUNTER — Other Ambulatory Visit: Payer: Self-pay

## 2020-02-04 ENCOUNTER — Encounter: Payer: Self-pay | Admitting: Family Medicine

## 2020-02-04 ENCOUNTER — Ambulatory Visit (INDEPENDENT_AMBULATORY_CARE_PROVIDER_SITE_OTHER): Payer: BC Managed Care – PPO | Admitting: Family Medicine

## 2020-02-04 VITALS — BP 158/80 | HR 74

## 2020-02-04 DIAGNOSIS — I10 Essential (primary) hypertension: Secondary | ICD-10-CM | POA: Diagnosis not present

## 2020-02-04 MED ORDER — AMLODIPINE BESYLATE 5 MG PO TABS: 5.0000 mg | ORAL_TABLET | Freq: Every day | ORAL | 1 refills | Status: DC

## 2020-02-04 NOTE — Progress Notes (Signed)
Virtual Visit via Telephone The purpose of this virtual visit is to provide medical care while limiting exposure to the novel coronavirus (COVID19) for both patient and office staff.  Consent was obtained for phone visit:  Yes.   Answered questions that patient had about telehealth interaction:  Yes.   I discussed the limitations, risks, security and privacy concerns of performing an evaluation and management service by telephone. I also discussed with the patient that there may be a patient responsible charge related to this service. The patient expressed understanding and agreed to proceed.  Patient Location: Home Provider Location: [facility])  ---------------------------------------------------------------------- Chief Complaint  Patient presents with  . Elevated BP    elevated bp,ranging  150/170- 80/90 w/ tightiness, pressure feeling around her head.     S: Reviewed CMA documentation. I have called patient and gathered additional HPI as follows:   HYPERTENSION, New diagnosis Reports home BP readings 150/90 recently, highest was 170/92, she describes higher salt intake can increase her BP, she stopped adding Iodized salt and has had improvement. Current Meds - None - never on meds prior   Lifestyle: - Diet: limiting salt now, reducing caffeine (drinks a lot more coffee in winter months) - Exercise: exercise regularly walking up to 30 min to 1 hour daily Denies CP, dyspnea, HA, edema, dizziness / lightheadedness  Hypothyroidism  No new concerns. Taking Levothyroxine daily.  Hyperlipidemia Recently started fish oil omega 3. Last lipid 2019 normal TG  Denies any high risk travel to areas of current concern for COVID19. Denies any known or suspected exposure to person with or possibly with COVID19.  Denies any fevers, chills, sweats, body ache, cough, shortness of breath, sinus pain or pressure, abdominal pain, diarrhea  History reviewed. No  pertinent past medical history. Social History   Tobacco Use  . Smoking status: Never Smoker  . Smokeless tobacco: Never Used  Substance Use Topics  . Alcohol use: No  . Drug use: No    Current Outpatient Medications:  .  Albuterol Sulfate (PROAIR RESPICLICK) 108 (90 Base) MCG/ACT AEPB, Inhale 1-2 puffs into the lungs every 6 (six) hours as needed (Wheezing and shortness of breath)., Disp: 1 each, Rfl: 2 .  fluticasone (FLONASE) 50 MCG/ACT nasal spray, Place 2 sprays into both nostrils daily., Disp: 16 g, Rfl: 6 .  levothyroxine (SYNTHROID) 50 MCG tablet, TAKE 1 TABLET(50 MCG) BY MOUTH DAILY, Disp: 90 tablet, Rfl: 1 .  Multiple Vitamin (MULTIVITAMIN) tablet, Take 1 tablet by mouth 2 (two) times daily., Disp: , Rfl:  .  omeprazole (PRILOSEC) 20 MG capsule, Take 1 capsule (20 mg total) by mouth 2 (two) times daily before a meal., Disp: 180 capsule, Rfl: 3 .  amLODipine (NORVASC) 5 MG tablet, Take 1 tablet (5 mg total) by mouth daily., Disp: 90 tablet, Rfl: 1  Depression screen [facility] 2/9 02/04/2020 12/08/2018 07/08/2016  Decreased Interest 0 0 0  Down, Depressed, Hopeless 0 0 0  PHQ - 2 Score 0 0 0    No flowsheet data found.  -------------------------------------------------------------------------- O: No physical exam performed due to remote telephone encounter.  Lab results reviewed.  No results found for this or any previous visit (from the past 2160 hour(s)).  -------------------------------------------------------------------------- A&P:  Problem List Items Addressed This Visit    None    Visit Diagnoses    Essential hypertension    -  Primary   Relevant Medications   amLODipine (NORVASC) 5 MG tablet     Elevated  BP, new diagnosis HTN multiple prior readings for while >150 SBP. Symptomatic with some headache - Home BP readings reviewed  No known complications    Plan:  1. START new med Amlodipine 5mg  daily, reviewed benefits risk side effects swelling possible 2.  Encourage improved lifestyle - low sodium diet, regular exercise 3. Continue monitor BP outside office, bring readings to next visit, if persistently >140/90 or new symptoms notify office sooner  She prefers to avoid long term medication, discussed that if improved lifestyle, she may be able to discontinue or reduce dose in future, but goal is to control BP and med may be needed, should try for 6-12 month first prior to stopping med or changing if doing well on it.  Meds ordered this encounter  Medications  . amLODipine (NORVASC) 5 MG tablet    Sig: Take 1 tablet (5 mg total) by mouth daily.    Dispense:  90 tablet    Refill:  1    Follow-up: - Return in 3 month HTN med adjust w/ new provider  Patient verbalizes understanding with the above medical recommendations including the limitation of remote medical advice.  Specific follow-up and call-back criteria were given for patient to follow-up or seek medical care more urgently if needed.   - Time spent in direct consultation with patient on phone: 16 minutes   Nobie Putnam, [facility] 02/04/2020, 3:49 PM

## 2020-02-04 NOTE — Patient Instructions (Addendum)
Start Amlodipine 5mg  daily in morning Keep track of BP readings Goal < 140/90 In future can adjust dose half pill or 2 pills if need May cause slight swelling or fluid retention feet ankles this does not cause harm to you. Keep improving lifestyle weight limit salt  Can stop fish oil omega 3 for now  Please schedule a Follow-up Appointment to: Return in about 3 months (around 05/03/2020) for 3 month HTN.  If you have any other questions or concerns, please feel free to call the office or send a message through MyChart. You may also schedule an earlier appointment if necessary.  Additionally, you may be receiving a survey about your experience at our office within a few days to 1 week by e-mail or mail. We value your feedback.  07/03/2020, DO [facility], [facility]

## 2020-03-21 ENCOUNTER — Other Ambulatory Visit: Payer: Self-pay

## 2020-03-21 DIAGNOSIS — K219 Gastro-esophageal reflux disease without esophagitis: Secondary | ICD-10-CM

## 2020-03-21 MED ORDER — OMEPRAZOLE 20 MG PO CPDR: 20.0000 mg | DELAYED_RELEASE_CAPSULE | Freq: Two times a day (BID) | ORAL | 0 refills | Status: DC

## 2020-03-28 ENCOUNTER — Other Ambulatory Visit: Payer: Self-pay

## 2020-03-28 DIAGNOSIS — E034 Atrophy of thyroid (acquired): Secondary | ICD-10-CM

## 2020-03-28 MED ORDER — LEVOTHYROXINE SODIUM 50 MCG PO TABS: ORAL_TABLET | ORAL | 0 refills | Status: DC

## 2020-04-25 ENCOUNTER — Other Ambulatory Visit: Payer: Self-pay | Admitting: Family Medicine

## 2020-04-25 DIAGNOSIS — K219 Gastro-esophageal reflux disease without esophagitis: Secondary | ICD-10-CM

## 2020-07-05 ENCOUNTER — Other Ambulatory Visit: Payer: Self-pay | Admitting: Family Medicine

## 2020-07-05 DIAGNOSIS — E034 Atrophy of thyroid (acquired): Secondary | ICD-10-CM

## 2020-07-05 NOTE — Telephone Encounter (Signed)
Requested Prescriptions  Pending Prescriptions Disp Refills   levothyroxine (SYNTHROID) 50 MCG tablet [Pharmacy Med Name: LEVOTHYROXINE 0.05MG  ( ) TAB] 90 tablet 0    Sig: TAKE 1 TABLET(50 MCG) BY MOUTH DAILY     Endocrinology:  Hypothyroid Agents Failed - 07/05/2020  7:13 AM      Failed - TSH needs to be rechecked within 3 months after an abnormal result. Refill until TSH is due.      Passed - TSH in normal range and within 360 days    TSH  Date Value Ref Range Status  08/24/2019 2.43 0.40 - 4.50 mIU/L Final         Passed - Valid encounter within last 12 months    Recent Outpatient Visits          5 months ago Essential hypertension   [facility] Smitty Cords, DO   10 months ago Mild intermittent asthma without complication   [facility] [name], NP   1 year ago Gastroesophageal reflux disease without esophagitis   [facility] Kyung Rudd, Alison Stalling, NP   2 years ago Bronchitis   [facility] [name], NP   2 years ago Acquired hypothyroidism   [facility] Kyung Rudd, Alison Stalling, NP

## 2020-07-10 ENCOUNTER — Other Ambulatory Visit: Payer: Self-pay | Admitting: Family Medicine

## 2020-07-10 DIAGNOSIS — I10 Essential (primary) hypertension: Secondary | ICD-10-CM

## 2020-07-10 NOTE — Telephone Encounter (Signed)
Attempted to call patient to schedule 6 month follow up appointment left message to call office. Courtesy RF #[phone] given to patient.

## 2020-07-18 ENCOUNTER — Ambulatory Visit: Payer: BC Managed Care – PPO | Admitting: Family Medicine

## 2020-07-18 ENCOUNTER — Other Ambulatory Visit: Payer: Self-pay

## 2020-07-18 ENCOUNTER — Encounter: Payer: Self-pay | Admitting: Family Medicine

## 2020-07-18 VITALS — BP 138/59 | HR 83 | Temp 98.3°F | Ht 63.0 in | Wt 193.2 lb

## 2020-07-18 DIAGNOSIS — Z1211 Encounter for screening for malignant neoplasm of colon: Secondary | ICD-10-CM | POA: Diagnosis not present

## 2020-07-18 DIAGNOSIS — K219 Gastro-esophageal reflux disease without esophagitis: Secondary | ICD-10-CM

## 2020-07-18 DIAGNOSIS — J4520 Mild intermittent asthma, uncomplicated: Secondary | ICD-10-CM

## 2020-07-18 DIAGNOSIS — E034 Atrophy of thyroid (acquired): Secondary | ICD-10-CM

## 2020-07-18 DIAGNOSIS — I10 Essential (primary) hypertension: Secondary | ICD-10-CM | POA: Insufficient documentation

## 2020-07-18 DIAGNOSIS — Z5320 Procedure and treatment not carried out because of patient's decision for unspecified reasons: Secondary | ICD-10-CM | POA: Insufficient documentation

## 2020-07-18 DIAGNOSIS — Z79899 Other long term (current) drug therapy: Secondary | ICD-10-CM

## 2020-07-18 MED ORDER — OMEPRAZOLE 20 MG PO CPDR: 20.0000 mg | DELAYED_RELEASE_CAPSULE | Freq: Two times a day (BID) | ORAL | 0 refills | Status: DC

## 2020-07-18 MED ORDER — AMLODIPINE BESYLATE 5 MG PO TABS: ORAL_TABLET | ORAL | 1 refills | Status: DC

## 2020-07-18 NOTE — Assessment & Plan Note (Signed)
Controlled hypothyroidism with TSH in normal range on a stable dose of levothyroxine for > 4 years.  No s/s of worsening hypothyroidism.  Plan: 1. Continue levothyroxine daily 2. Have labs drawn for TSH evaluation in the next 1-2 weeks 3. Follow up in 6 months

## 2020-07-18 NOTE — Progress Notes (Signed)
Subjective:    Patient ID: Natalie Kidd, female    DOB: [birth year], 69 y.o.   MRN: [id number]  Natalie Kidd is a 69 y.o. female presenting on 07/18/2020 for Hypertension and Hypothyroidism   HPI  Ms. Graybeal presents to clinic for follow up on her hypothyroidism and hypertension.  Has no acute concerns today.  Hypertension - She is not checking BP at home or outside of clinic.    - Current medications: amlodipine 5mg  daily, tolerating well without side effects - She is not currently symptomatic. - Pt denies headache, lightheadedness, dizziness, changes in vision, chest tightness/pressure, palpitations, leg swelling, sudden loss of speech or loss of consciousness. - She  reports no regular exercise routine. - Her diet is high in salt, high in fat, and high in carbohydrates.  Depression screen [facility] 2/9 02/04/2020 12/08/2018 07/08/2016  Decreased Interest 0 0 0  Down, Depressed, Hopeless 0 0 0  PHQ - 2 Score 0 0 0    Social History   Tobacco Use  . Smoking status: Never Smoker  . Smokeless tobacco: Never Used  Vaping Use  . Vaping Use: Never used  Substance Use Topics  . Alcohol use: No  . Drug use: No    Review of Systems  Constitutional: Negative.   HENT: Negative.   Eyes: Negative.   Respiratory: Negative.   Cardiovascular: Negative.   Gastrointestinal: Negative.   Endocrine: Negative.   Genitourinary: Negative.   Musculoskeletal: Negative.   Skin: Negative.   Allergic/Immunologic: Negative.   Neurological: Negative.   Hematological: Negative.   Psychiatric/Behavioral: Negative.    Per HPI unless specifically indicated above     Objective:    BP (!) 138/59 (BP Location: Left Arm, Patient Position: Sitting, Cuff Size: Normal)   Pulse 83   Temp 98.3 F (36.8 C) (Oral)   Ht 5\' 3"  (1.6 m)   Wt 193 lb 3.2 oz (87.6 kg)   BMI 34.22 kg/m   Wt Readings from Last 3 Encounters:  07/18/20 193 lb 3.2 oz (87.6 kg)  12/08/18 193 lb (87.5 kg)  01/06/18 189 lb  6.4 oz (85.9 kg)    Physical Exam Vitals reviewed.  Constitutional:      General: She is not in acute distress.    Appearance: Normal appearance. She is well-developed and well-groomed. She is obese. She is not ill-appearing or toxic-appearing.  HENT:     Head: Normocephalic and atraumatic.     Nose:     Comments: 14/10/19 is in place, covering mouth and nose. Eyes:     General: Lids are normal. Vision grossly intact.        Right eye: No discharge.        Left eye: No discharge.     Extraocular Movements: Extraocular movements intact.     Conjunctiva/sclera: Conjunctivae normal.     Pupils: Pupils are equal, round, and reactive to light.  Neck:     Thyroid: No thyroid mass, thyromegaly or thyroid tenderness.  Cardiovascular:     Rate and Rhythm: Normal rate and regular rhythm.     Pulses: Normal pulses.          Dorsalis pedis pulses are 2+ on the right side and 2+ on the left side.     Heart sounds: Normal heart sounds. No murmur heard.  No friction rub. No gallop.   Pulmonary:     Effort: Pulmonary effort is normal. No respiratory distress.     Breath sounds: Normal breath sounds.  Musculoskeletal:     Cervical back: Normal range of motion and neck supple. No tenderness.     Right lower leg: No edema.     Left lower leg: No edema.  Lymphadenopathy:     Cervical: No cervical adenopathy.  Skin:    General: Skin is warm and dry.     Capillary Refill: Capillary refill takes less than 2 seconds.  Neurological:     General: No focal deficit present.     Mental Status: She is alert and oriented to person, place, and time.  Psychiatric:        Attention and Perception: Attention and perception normal.        Mood and Affect: Mood and affect normal.        Speech: Speech normal.        Behavior: Behavior normal. Behavior is cooperative.        Thought Content: Thought content normal.        Cognition and Memory: Cognition and memory normal.        Judgment: Judgment normal.      Results for orders placed or performed in visit on 08/23/19  TSH  Result Value Ref Range   TSH 2.43 0.40 - 4.50 mIU/L  T4, free  Result Value Ref Range   Free T4 1.1 0.8 - 1.8 ng/dL  CBC with Differential/Platelet  Result Value Ref Range   WBC 6.3 3.8 - 10.8 Thousand/uL   RBC 5.19 (H) 3.80 - 5.10 Million/uL   Hemoglobin 15.0 11.7 - 15.5 g/dL   HCT 81.1 (H) 35 - 45 %   MCV 87.9 80.0 - 100.0 fL   MCH 28.9 27.0 - 33.0 pg   MCHC 32.9 32.0 - 36.0 g/dL   RDW 57.2 62.0 - 35.5 %   Platelets 281 140 - 400 Thousand/uL   MPV 9.9 7.5 - 12.5 fL   Neutro Abs 3,408 1,500 - 7,800 cells/uL   Lymphs Abs 2,066 850 - 3,900 cells/uL   Absolute Monocytes 586 200 - 950 cells/uL   Eosinophils Absolute 202 15 - 500 cells/uL   Basophils Absolute 38 0 - 200 cells/uL   Neutrophils Relative % 54.1 %   Total Lymphocyte 32.8 %   Monocytes Relative 9.3 %   Eosinophils Relative 3.2 %   Basophils Relative 0.6 %      Assessment & Plan:   Problem List Items Addressed This Visit      Cardiovascular and Mediastinum   Hypertension    Slightly elevated BP readings in clinic today.  BP is above goal < 130/80.  Pt is working on lifestyle modifications.  Taking medications tolerating well without side effects.  Complications: obesity, hypothyroidism, GERD  Plan: 1. Continue taking amlodipine 5mg  daily 2. Obtain labs in the next 1-2 weeks  3. Encouraged heart healthy diet and increasing exercise to 30 minutes most days of the week, going no more than 2 days in a row without exercise. 4. Check BP 1-2 x per week at home, keep log, and bring to clinic at next appointment. 5. Follow up 6 months.       Relevant Medications   amLODipine (NORVASC) 5 MG tablet   Other Relevant Orders   CBC with Differential   COMPLETE METABOLIC PANEL WITH GFR     Digestive   GERD (gastroesophageal reflux disease)    Currently well controlled on Omeprazole 20mg  twice daily.  Plan: 1. Continue omeprazole 20mg  twice daily.  Side effects discussed. Pt wants to continue med.  2. Avoid diet triggers. Reviewed need to seek care if globus sensation, difficulty swallowing, s/sx of GI bleed. 3. Follow up as needed and in 6 months.       Relevant Medications   omeprazole (PRILOSEC) 20 MG capsule     Endocrine   Hypothyroidism    Controlled hypothyroidism with TSH in normal range on a stable dose of levothyroxine for > 4 years.  No s/s of worsening hypothyroidism.  Plan: 1. Continue levothyroxine daily 2. Have labs drawn for TSH evaluation in the next 1-2 weeks 3. Follow up in 6 months      Relevant Orders   Thyroid Panel With TSH     Other   Screening for colon cancer - Primary    Pt requiring colon cancer screening.  Negative family history of colon cancer.  Plan: - Discussed timing for initiation of colon cancer screening ACS vs USPSTF guidelines - Mutual decision making discussion for options of colonoscopy vs cologuard.  Pt prefers cologuard. - Ordered Cologuard today - Patient advised to contact insurance first to learn cost.       Relevant Orders   Cologuard   Breast screening declined    Pt last mammogram 05/30/2011.  Result BI-RADS 1 Negative.  Discussed screening mammogram and importance of screening for breast cancer, as early detection can lead to better outcomes. Patient verbalizes understanding and declines screening/mammogram.  Plan: 1. Encourage screening mammogram at every office visit       Cervical cancer screening declined    Discussed need for cervical cancer screening.  Patient without screening in >10 years.  Patient reports last few PAP testing were negative, no lab reports available to review.  Discussed importance of screening, at least one additional time, and if negative, would not need any additional cervical cancer screening.  Patient verbalized understanding and declined screening.       Other Visit Diagnoses    Mild intermittent asthma without complication        Long-term use of high-risk medication       Relevant Orders   Lipid Profile      Meds ordered this encounter  Medications  . amLODipine (NORVASC) 5 MG tablet    Sig: TAKE 1 TABLET(5 MG) BY MOUTH DAILY    Dispense:  90 tablet    Refill:  1  . omeprazole (PRILOSEC) 20 MG capsule    Sig: Take 1 capsule (20 mg total) by mouth 2 (two) times daily before a meal.    Dispense:  180 capsule    Refill:  0      Follow up plan: Return in about 6 months (around 01/18/2021) for HTN and hypothyroid f/u.   Charlaine Dalton, FNP Family Nurse Practitioner [facility] 07/18/2020, 9:26 AM

## 2020-07-18 NOTE — Assessment & Plan Note (Signed)
Currently well controlled on Omeprazole 20mg  twice daily.  Plan: 1. Continue omeprazole 20mg  twice daily. Side effects discussed. Pt wants to continue med. 2. Avoid diet triggers. Reviewed need to seek care if globus sensation, difficulty swallowing, s/sx of GI bleed. 3. Follow up as needed and in 6 months.

## 2020-07-18 NOTE — Patient Instructions (Signed)
Have your labs drawn in the next 1-2 weeks  Your medication refills have been sent to your pharmacy on file.  I have sent in the order for Cologuard for colon cancer screening.  Sleep hygiene is the single most effective treatment for sleep issues, but it is hard work.  Tips for a good night's sleep:  -Keep sleep environment comfortable and conducive to sleep -Keep regular sleep schedule 7 nights a week -Avoiding naps during the day -Avoiding going to bed until drowsy and ready to sleep, not trying to sleep, and not watching the clock -Get out of bed if not asleep within 15-20 minutes and returning only when drowsy -Avoiding caffeine, nicotine, alcohol, and other substances that interfere with sleep before bedtime -Take an hour before your set bedtime and start to wind down: bath/shower, no more TV or phone (the blue light can interfere with sleeping), listen to soothing music, or meditation -No TV in your bedroom -Exercising regularly, at least 6 hours before sleep. Yoga and Tai Chi can improve sleep quality  There are a lot of books and apps that may help guide you with any of the following:   -Progressive muscle relaxation (involves methodical tension and relaxation of different Muscle groups throughout body)  Guided imagery  -YouTube - Gwynne Edinger has free videos on YouTube that can help with meditation and some   Abdominal breathing   Over the counter sleep aid one hour before bed- and gradually wean your use over 2-4 weeks  Some examples are : *Melatonin 5-10 mg *Sleepology (Can find on [employer]) taken according to packaging directions  There are a few online evidence based online programs, unfortunately they are not free.   Developed by a sleep expert who created a drug-free program for insomnia proven more effective than sleeping pills.  www.cbtforinsomnia.com Sleepio is an evidence-based digital sleep improvement program   www.sleepio.com SHUTi is designed to  actively help retrain your body and mind for great sleep through six engaging Cognitive Behavioral Therapy for Insomnia strategy and learning sessions  [email or web address]  We will plan to see you back in 6 months for hypertension and hypothyroid follow up visit  You will receive a survey after today's visit either digitally by e-mail or paper by [place] mail. Your experiences and feedback matter to [place].  Please respond so we know how we are doing as we provide care for you.  Call us with any questions/concerns/needs.  It is my goal to be available to you for your health concerns.  Thanks for choosing me to be a partner in your healthcare needs!  Harlin Rain, FNP-C Family Nurse Practitioner [facility] Phone: [phone]

## 2020-07-18 NOTE — Assessment & Plan Note (Signed)
Discussed need for cervical cancer screening.  Patient without screening in >10 years.  Patient reports last few PAP testing were negative, no lab reports available to review.  Discussed importance of screening, at least one additional time, and if negative, would not need any additional cervical cancer screening.  Patient verbalized understanding and declined screening.

## 2020-07-18 NOTE — Assessment & Plan Note (Signed)
Pt last mammogram 05/30/2011.  Result BI-RADS 1 Negative.  Discussed screening mammogram and importance of screening for breast cancer, as early detection can lead to better outcomes. Patient verbalizes understanding and declines screening/mammogram.  Plan: 1. Encourage screening mammogram at every office visit

## 2020-07-18 NOTE — Assessment & Plan Note (Signed)
Slightly elevated BP readings in clinic today.  BP is above goal < 130/80.  Pt is working on lifestyle modifications.  Taking medications tolerating well without side effects.  Complications: obesity, hypothyroidism, GERD  Plan: 1. Continue taking amlodipine 5mg  daily 2. Obtain labs in the next 1-2 weeks  3. Encouraged heart healthy diet and increasing exercise to 30 minutes most days of the week, going no more than 2 days in a row without exercise. 4. Check BP 1-2 x per week at home, keep log, and bring to clinic at next appointment. 5. Follow up 6 months.

## 2020-07-18 NOTE — Assessment & Plan Note (Signed)
Pt requiring colon cancer screening.  Negative family history of colon cancer.  Plan: - Discussed timing for initiation of colon cancer screening ACS vs USPSTF guidelines - Mutual decision making discussion for options of colonoscopy vs cologuard.  Pt prefers cologuard. - Ordered Cologuard today - Patient advised to contact insurance first to learn cost.

## 2020-07-24 ENCOUNTER — Other Ambulatory Visit: Payer: Self-pay

## 2020-07-24 DIAGNOSIS — I10 Essential (primary) hypertension: Secondary | ICD-10-CM

## 2020-07-24 DIAGNOSIS — E034 Atrophy of thyroid (acquired): Secondary | ICD-10-CM

## 2020-07-24 DIAGNOSIS — Z79899 Other long term (current) drug therapy: Secondary | ICD-10-CM

## 2020-07-24 LAB — COMPLETE METABOLIC PANEL WITH GFR
AG Ratio: 1.6 (calc) (ref 1.0–2.5)
ALT: 98 U/L — ABNORMAL HIGH (ref 6–29)
AST: 71 U/L — ABNORMAL HIGH (ref 10–35)
Albumin: 4.4 g/dL (ref 3.6–5.1)
Alkaline phosphatase (APISO): 65 U/L (ref 37–153)
BUN: 19 mg/dL (ref 7–25)
CO2: 28 mmol/L (ref 20–32)
Calcium: 9.5 mg/dL (ref 8.6–10.4)
Chloride: 106 mmol/L (ref 98–110)
Creat: 0.95 mg/dL (ref 0.50–0.99)
GFR, Est African American: 71 mL/min/{1.73_m2} (ref >=60)
GFR, Est Non African American: 61 mL/min/{1.73_m2} (ref >=60)
Globulin: 2.7 g/dL (calc) (ref 1.9–3.7)
Glucose, Bld: 90 mg/dL (ref 65–99)
Potassium: 4.4 mmol/L (ref 3.5–5.3)
Sodium: 140 mmol/L (ref 135–146)
Total Bilirubin: 0.7 mg/dL (ref 0.2–1.2)
Total Protein: 7.1 g/dL (ref 6.1–8.1)

## 2020-07-24 LAB — CBC WITH DIFFERENTIAL/PLATELET
Absolute Monocytes: 550 cells/uL (ref 200–950)
Basophils Absolute: 38 cells/uL (ref 0–200)
Basophils Relative: 0.6 %
Eosinophils Absolute: 179 cells/uL (ref 15–500)
Eosinophils Relative: 2.8 %
HCT: 45.4 % — ABNORMAL HIGH (ref 35.0–45.0)
Hemoglobin: 15.1 g/dL (ref 11.7–15.5)
Lymphs Abs: 1702 cells/uL (ref 850–3900)
MCH: 29.9 pg (ref 27.0–33.0)
MCHC: 33.3 g/dL (ref 32.0–36.0)
MCV: 89.9 fL (ref 80.0–100.0)
MPV: 9.5 fL (ref 7.5–12.5)
Monocytes Relative: 8.6 %
Neutro Abs: 3930 cells/uL (ref 1500–7800)
Neutrophils Relative %: 61.4 %
Platelets: 250 10*3/uL (ref 140–400)
RBC: 5.05 10*6/uL (ref 3.80–5.10)
RDW: 13.1 % (ref 11.0–15.0)
Total Lymphocyte: 26.6 %
WBC: 6.4 10*3/uL (ref 3.8–10.8)

## 2020-07-24 LAB — THYROID PANEL WITH TSH
Free Thyroxine Index: 2.4 (ref 1.4–3.8)
T3 Uptake: 26 % (ref 22–35)
T4, Total: 9.3 ug/dL (ref 5.1–11.9)
TSH: 1.69 mIU/L (ref 0.40–4.50)

## 2020-07-24 LAB — LIPID PANEL
Cholesterol: 197 mg/dL (ref <200)
HDL: 48 mg/dL — ABNORMAL LOW (ref >=50)
LDL Cholesterol (Calc): 122 mg/dL (calc) — ABNORMAL HIGH
Non-HDL Cholesterol (Calc): 149 mg/dL (calc) — ABNORMAL HIGH (ref <130)
Total CHOL/HDL Ratio: 4.1 (calc) (ref <5.0)
Triglycerides: 152 mg/dL — ABNORMAL HIGH (ref <150)

## 2020-07-25 ENCOUNTER — Other Ambulatory Visit: Payer: Self-pay | Admitting: Family Medicine

## 2020-07-25 DIAGNOSIS — E034 Atrophy of thyroid (acquired): Secondary | ICD-10-CM

## 2020-07-25 DIAGNOSIS — R7989 Other specified abnormal findings of blood chemistry: Secondary | ICD-10-CM | POA: Insufficient documentation

## 2020-07-25 MED ORDER — LEVOTHYROXINE SODIUM 50 MCG PO TABS: ORAL_TABLET | ORAL | 1 refills | Status: DC

## 2020-07-31 ENCOUNTER — Ambulatory Visit: Payer: BC Managed Care – PPO

## 2020-08-01 ENCOUNTER — Other Ambulatory Visit: Payer: Self-pay

## 2020-08-01 ENCOUNTER — Other Ambulatory Visit: Payer: Self-pay | Admitting: Family Medicine

## 2020-08-01 ENCOUNTER — Ambulatory Visit
Admission: RE | Admit: 2020-08-01 | Discharge: 2020-08-01 | Disposition: A | Discharge status: Home / Self Care | Payer: BC Managed Care – PPO | Source: Ambulatory Visit | Attending: Family Medicine | Admitting: Family Medicine

## 2020-08-01 DIAGNOSIS — R7989 Other specified abnormal findings of blood chemistry: Secondary | ICD-10-CM | POA: Diagnosis not present

## 2020-08-01 DIAGNOSIS — Z1211 Encounter for screening for malignant neoplasm of colon: Secondary | ICD-10-CM

## 2020-08-01 DIAGNOSIS — K760 Fatty (change of) liver, not elsewhere classified: Secondary | ICD-10-CM | POA: Insufficient documentation

## 2020-08-02 LAB — COLOGUARD: Cologuard: NEGATIVE

## 2020-08-09 LAB — EXTERNAL GENERIC LAB PROCEDURE: COLOGUARD: NEGATIVE

## 2020-08-11 ENCOUNTER — Other Ambulatory Visit: Payer: Self-pay | Admitting: Family Medicine

## 2020-10-03 ENCOUNTER — Other Ambulatory Visit: Payer: Self-pay | Admitting: Nurse Practitioner

## 2020-10-03 DIAGNOSIS — K219 Gastro-esophageal reflux disease without esophagitis: Secondary | ICD-10-CM

## 2021-02-09 ENCOUNTER — Other Ambulatory Visit: Payer: Self-pay | Admitting: Family Medicine

## 2021-02-09 DIAGNOSIS — I10 Essential (primary) hypertension: Secondary | ICD-10-CM

## 2021-02-09 MED ORDER — AMLODIPINE BESYLATE 5 MG PO TABS: ORAL_TABLET | ORAL | 1 refills | Status: DC

## 2021-04-19 ENCOUNTER — Other Ambulatory Visit: Payer: Self-pay

## 2021-04-19 DIAGNOSIS — E034 Atrophy of thyroid (acquired): Secondary | ICD-10-CM

## 2021-04-19 MED ORDER — LEVOTHYROXINE SODIUM 50 MCG PO TABS: ORAL_TABLET | ORAL | 1 refills | Status: DC

## 2021-07-19 ENCOUNTER — Other Ambulatory Visit: Payer: Self-pay

## 2021-07-19 DIAGNOSIS — K219 Gastro-esophageal reflux disease without esophagitis: Secondary | ICD-10-CM

## 2021-07-19 MED ORDER — OMEPRAZOLE 20 MG PO CPDR: DELAYED_RELEASE_CAPSULE | ORAL | 0 refills | Status: DC

## 2021-08-01 ENCOUNTER — Telehealth: Payer: Self-pay

## 2021-08-01 NOTE — Telephone Encounter (Signed)
Copied from CRM [id number]. Topic: General - Other >> Aug 01, 2021 10:53 AM Wyonia Hough E wrote: Reason for CRM: Pt wanted to ask if she can establish with Dr. Kirtland Bouchard / pt stated that her PCP keeps leaving [place] like to go with someone who stays and will know her/ please advise

## 2021-08-01 NOTE — Telephone Encounter (Signed)
That is okay if she prefers to switch to me instead of Regina at this time.  Her husband is my patient.  I will say that decision is up to her.  Thank you  Saralyn Pilar, DO [facility] 08/01/2021, 4:30 PM

## 2021-08-02 ENCOUNTER — Encounter: Payer: Self-pay | Admitting: Internal Medicine

## 2021-08-02 ENCOUNTER — Ambulatory Visit (INDEPENDENT_AMBULATORY_CARE_PROVIDER_SITE_OTHER): Payer: BC Managed Care – PPO | Admitting: Internal Medicine

## 2021-08-02 ENCOUNTER — Other Ambulatory Visit: Payer: Self-pay

## 2021-08-02 VITALS — BP 120/59 | Temp 97.9°F | Resp 17 | Ht 63.0 in | Wt 194.8 lb

## 2021-08-02 DIAGNOSIS — E782 Mixed hyperlipidemia: Secondary | ICD-10-CM | POA: Diagnosis not present

## 2021-08-02 DIAGNOSIS — E781 Pure hyperglyceridemia: Secondary | ICD-10-CM

## 2021-08-02 DIAGNOSIS — K219 Gastro-esophageal reflux disease without esophagitis: Secondary | ICD-10-CM

## 2021-08-02 DIAGNOSIS — E6609 Other obesity due to excess calories: Secondary | ICD-10-CM

## 2021-08-02 DIAGNOSIS — E034 Atrophy of thyroid (acquired): Secondary | ICD-10-CM | POA: Diagnosis not present

## 2021-08-02 DIAGNOSIS — I10 Essential (primary) hypertension: Secondary | ICD-10-CM

## 2021-08-02 DIAGNOSIS — Z6834 Body mass index (BMI) 34.0-34.9, adult: Secondary | ICD-10-CM

## 2021-08-02 MED ORDER — AMLODIPINE BESYLATE 5 MG PO TABS: ORAL_TABLET | ORAL | 1 refills | Status: DC

## 2021-08-02 MED ORDER — LEVOTHYROXINE SODIUM 50 MCG PO TABS: ORAL_TABLET | ORAL | 1 refills | Status: DC

## 2021-08-02 MED ORDER — OMEPRAZOLE 20 MG PO CPDR: DELAYED_RELEASE_CAPSULE | ORAL | 1 refills | Status: DC

## 2021-08-02 NOTE — Progress Notes (Signed)
Subjective:    Patient ID: Natalie Kidd, female    DOB: July 17, 1951, 70 y.o.   MRN: [id number]  HPI  Pt presents to the clinic today for follow up of chronic conditions. She is establishing care with me today, transferring care from [facility], NP.  HTN: Her BP today is 120/59. She is taking Amlodipine as prescribed but would like to consider trying to get off this medication. ECG from 11/2018 reviewed.  GERD: Triggered by certain foods, and eating late at night. She denies breakthrough on Omeprazole. There is no upper GI on file.   Hypothyroidism: She denies any issues on her current dose of Levothyroxine. She does not follow with endocrinology.  HLD: Her last LDL was 122, triglycerides 152, 06/2020. She is not taking any cholesterol lowering medication at this time. She tries to consume a low fat diet.  Review of Systems     No past medical history on file.  Current Outpatient Medications  Medication Sig Dispense Refill   Albuterol Sulfate (PROAIR RESPICLICK) 108 (90 Base) MCG/ACT AEPB Inhale 1-2 puffs into the lungs every 6 (six) hours as needed (Wheezing and shortness of breath). 1 each 2   amLODipine (NORVASC) 5 MG tablet TAKE 1 TABLET(5 MG) BY MOUTH DAILY 90 tablet 1   fluticasone (FLONASE) 50 MCG/ACT nasal spray Place 2 sprays into both nostrils daily. 16 g 6   levothyroxine (SYNTHROID) 50 MCG tablet Take 1 tablet daily before breakfast 90 tablet 1   Multiple Vitamin (MULTIVITAMIN) tablet Take 1 tablet by mouth 2 (two) times daily.     omeprazole (PRILOSEC) 20 MG capsule TAKE 1 CAPSULE(20 MG) BY MOUTH TWICE DAILY BEFORE A MEAL 180 capsule 0   No current facility-administered medications for this visit.    Allergies  Allergen Reactions   Bee Venom Swelling   Flagyl [Metronidazole] Other (See Comments)    Loss of motor skills   Penicillins    Tape Hives    Family History  Problem Relation Age of Onset   Heart disease Father    Kidney disease Father     Social  History   Socioeconomic History   Marital status: Married    Spouse name: Not on file   Number of children: Not on file   Years of education: Not on file   Highest education level: Not on file  Occupational History   Not on file  Tobacco Use   Smoking status: Never   Smokeless tobacco: Never  Vaping Use   Vaping Use: Never used  Substance and Sexual Activity   Alcohol use: No   Drug use: No   Sexual activity: Not on file  Other Topics Concern   Not on file  Social History Narrative   Not on file   Social Determinants of Health   Financial Resource Strain: Not on file  Food Insecurity: Not on file  Transportation Needs: Not on file  Physical Activity: Not on file  Stress: Not on file  Social Connections: Not on file  Intimate Partner Violence: Not on file     Constitutional: Denies fever, malaise, fatigue, headache or abrupt weight changes.  HEENT: Denies eye pain, eye redness, ear pain, ringing in the ears, wax buildup, runny nose, nasal congestion, bloody nose, or sore throat. Respiratory: Denies difficulty breathing, shortness of breath, cough or sputum production.   Cardiovascular: Denies chest pain, chest tightness, palpitations or swelling in the hands or feet.  Gastrointestinal: Denies abdominal pain, bloating, constipation, diarrhea or blood  in the stool.  Musculoskeletal: Denies decrease in range of motion, difficulty with gait, muscle pain or joint pain and swelling.  Skin: Denies redness, rashes, lesions or ulcercations.  Neurological: Denies dizziness, difficulty with memory, difficulty with speech or problems with balance and coordination.   No other specific complaints in a complete review of systems (except as listed in HPI above).  Objective:   Physical Exam BP (!) 120/59 (BP Location: Right Arm, Patient Position: Sitting, Cuff Size: Large)   Temp 97.9 F (36.6 C) (Temporal)   Resp 17   Ht 5\' 3"  (1.6 m)   Wt 194 lb 12.8 oz (88.4 kg)   SpO2 99%    BMI 34.51 kg/m   Wt Readings from Last 3 Encounters:  07/18/20 193 lb 3.2 oz (87.6 kg)  12/08/18 193 lb (87.5 kg)  01/06/18 189 lb 6.4 oz (85.9 kg)    General: Appears their stated age, obese, in NAD. Skin: Warm, dry and intact.  HEENT: Head: normal shape and size; Eyes: sclera white and EOMs intact;  Neck:  Neck supple, trachea midline. No masses, lumps present.  Cardiovascular: Normal rate and rhythm. S1,S2 noted.  No murmur, rubs or gallops noted. No JVD or BLE edema. Pulmonary/Chest: Normal effort and positive vesicular breath sounds. No respiratory distress. No wheezes, rales or ronchi noted.  Abdomen: Normal bowel sounds.  Musculoskeletal: No difficulty with gait.  Neurological: Alert and oriented.  BMET    Component Value Date/Time   NA 140 07/24/2020 0837   K 4.4 07/24/2020 0837   CL 106 07/24/2020 0837   CO2 28 07/24/2020 0837   GLUCOSE 90 07/24/2020 0837   BUN 19 07/24/2020 0837   CREATININE 0.95 07/24/2020 0837   CALCIUM 9.5 07/24/2020 0837   GFRNONAA 61 07/24/2020 0837   GFRAA 71 07/24/2020 0837    Lipid Panel     Component Value Date/Time   CHOL 197 07/24/2020 0837   TRIG 152 (H) 07/24/2020 0837   HDL 48 (L) 07/24/2020 0837   CHOLHDL 4.1 07/24/2020 0837   LDLCALC 122 (H) 07/24/2020 0837    CBC    Component Value Date/Time   WBC 6.4 07/24/2020 0837   RBC 5.05 07/24/2020 0837   HGB 15.1 07/24/2020 0837   HCT 45.4 (H) 07/24/2020 0837   PLT 250 07/24/2020 0837   MCV 89.9 07/24/2020 0837   MCH 29.9 07/24/2020 0837   MCHC 33.3 07/24/2020 0837   RDW 13.1 07/24/2020 0837   LYMPHSABS 1,702 07/24/2020 0837   EOSABS 179 07/24/2020 0837   BASOSABS 38 07/24/2020 0837    Hgb A1C No results found for: HGBA1C          Assessment & Plan:   07/26/2020, NP This visit occurred during the SARS-CoV-2 public health emergency.  Safety protocols were in place, including screening questions prior to the visit, additional usage of staff PPE, and extensive  cleaning of exam room while observing appropriate contact time as indicated for disinfecting solutions.

## 2021-08-03 DIAGNOSIS — E781 Pure hyperglyceridemia: Secondary | ICD-10-CM | POA: Insufficient documentation

## 2021-08-03 DIAGNOSIS — E782 Mixed hyperlipidemia: Secondary | ICD-10-CM | POA: Insufficient documentation

## 2021-08-03 LAB — LIPID PANEL
Cholesterol: 179 mg/dL (ref <200)
HDL: 43 mg/dL — ABNORMAL LOW (ref >=50)
LDL Cholesterol (Calc): 99 mg/dL (calc)
Non-HDL Cholesterol (Calc): 136 mg/dL (calc) — ABNORMAL HIGH (ref <130)
Total CHOL/HDL Ratio: 4.2 (calc) (ref <5.0)
Triglycerides: 259 mg/dL — ABNORMAL HIGH (ref <150)

## 2021-08-03 LAB — COMPLETE METABOLIC PANEL WITH GFR
AG Ratio: 1.8 (calc) (ref 1.0–2.5)
ALT: 74 U/L — ABNORMAL HIGH (ref 6–29)
AST: 59 U/L — ABNORMAL HIGH (ref 10–35)
Albumin: 4.5 g/dL (ref 3.6–5.1)
Alkaline phosphatase (APISO): 73 U/L (ref 37–153)
BUN/Creatinine Ratio: 16 (calc) (ref 6–22)
BUN: 17 mg/dL (ref 7–25)
CO2: 25 mmol/L (ref 20–32)
Calcium: 9.2 mg/dL (ref 8.6–10.4)
Chloride: 106 mmol/L (ref 98–110)
Creat: 1.05 mg/dL — ABNORMAL HIGH (ref 0.60–1.00)
Globulin: 2.5 g/dL (calc) (ref 1.9–3.7)
Glucose, Bld: 124 mg/dL (ref 65–139)
Potassium: 4.1 mmol/L (ref 3.5–5.3)
Sodium: 140 mmol/L (ref 135–146)
Total Bilirubin: 0.4 mg/dL (ref 0.2–1.2)
Total Protein: 7 g/dL (ref 6.1–8.1)
eGFR: 57 mL/min/{1.73_m2} — ABNORMAL LOW (ref >=60)

## 2021-08-03 LAB — T4, FREE: Free T4: 1.2 ng/dL (ref 0.8–1.8)

## 2021-08-03 LAB — CBC
HCT: 44.3 % (ref 35.0–45.0)
Hemoglobin: 14.5 g/dL (ref 11.7–15.5)
MCH: 29.2 pg (ref 27.0–33.0)
MCHC: 32.7 g/dL (ref 32.0–36.0)
MCV: 89.1 fL (ref 80.0–100.0)
MPV: 9.9 fL (ref 7.5–12.5)
Platelets: 327 10*3/uL (ref 140–400)
RBC: 4.97 10*6/uL (ref 3.80–5.10)
RDW: 13.1 % (ref 11.0–15.0)
WBC: 9.2 10*3/uL (ref 3.8–10.8)

## 2021-08-03 LAB — TSH: TSH: 2.02 mIU/L (ref 0.40–4.50)

## 2021-08-03 NOTE — Assessment & Plan Note (Signed)
Encouraged diet and exercise for weight loss ?

## 2021-08-03 NOTE — Assessment & Plan Note (Signed)
Will decrease Amlodipine to 5 mg daily Reinforced DASH diet and exercise for weight loss CMET today

## 2021-08-03 NOTE — Assessment & Plan Note (Signed)
CMET and lipid profile today Encouraged her to consume a low fat diet 

## 2021-08-03 NOTE — Assessment & Plan Note (Signed)
TSH and Free T4 today Will adjust Levothyroxine if needed based on labs 

## 2021-08-03 NOTE — Patient Instructions (Signed)
Heart-Healthy Eating Plan Heart-healthy meal planning includes: Eating less unhealthy fats. Eating more healthy fats. Making other changes in your diet. Talk with your doctor or a diet specialist (dietitian) to create an eating plan that is right for you. What is my plan? Your doctor may recommend an eating plan that includes: Total fat: ______% or less of total calories a day. Saturated fat: ______% or less of total calories a day. Cholesterol: less than _________mg a day. What are tips for following this plan? Cooking Avoid frying your food. Try to bake, boil, grill, or broil it instead. You can also reduce fat by: Removing the skin from poultry. Removing all visible fats from meats. Steaming vegetables in water or broth. Meal planning  At meals, divide your plate into four equal parts: Fill one-half of your plate with vegetables and green salads. Fill one-fourth of your plate with whole grains. Fill one-fourth of your plate with lean protein foods. Eat 4-5 servings of vegetables per day. A serving of vegetables is: 1 cup of raw or cooked vegetables. 2 cups of raw leafy greens. Eat 4-5 servings of fruit per day. A serving of fruit is: 1 medium whole fruit.  cup of dried fruit.  cup of fresh, frozen, or canned fruit.  cup of 100% fruit juice. Eat more foods that have soluble fiber. These are apples, broccoli, carrots, beans, peas, and barley. Try to get 20-30 g of fiber per day. Eat 4-5 servings of nuts, legumes, and seeds per week: 1 serving of dried beans or legumes equals  cup after being cooked. 1 serving of nuts is  cup. 1 serving of seeds equals 1 tablespoon.  General information Eat more home-cooked food. Eat less restaurant, buffet, and fast food. Limit or avoid alcohol. Limit foods that are high in starch and sugar. Avoid fried foods. Lose weight if you are overweight. Keep track of how much salt (sodium) you eat. This is important if you have high blood  pressure. Ask your doctor to tell you more about this. Try to add vegetarian meals each week. Fats Choose healthy fats. These include olive oil and canola oil, flaxseeds, walnuts, almonds, and seeds. Eat more omega-3 fats. These include salmon, mackerel, sardines, tuna, flaxseed oil, and ground flaxseeds. Try to eat fish at least 2 times each week. Check food labels. Avoid foods with trans fats or high amounts of saturated fat. Limit saturated fats. These are often found in animal products, such as meats, butter, and cream. These are also found in plant foods, such as palm oil, palm kernel oil, and coconut oil. Avoid foods with partially hydrogenated oils in them. These have trans fats. Examples are stick margarine, some tub margarines, cookies, crackers, and other baked goods. What foods can I eat? Fruits All fresh, canned (in natural juice), or frozen fruits. Vegetables Fresh or frozen vegetables (raw, steamed, roasted, or grilled). Green salads. Grains Most grains. Choose whole wheat and whole grains most of the time. Rice andpasta, including brown rice and pastas made with whole wheat. Meats and other proteins Lean, well-trimmed beef, veal, pork, and lamb. Chicken and turkey without skin. All fish and shellfish. Wild duck, rabbit, pheasant, and venison. Egg whites or low-cholesterol egg substitutes. Dried beans, peas, lentils, and tofu. Seedsand most nuts. Dairy Low-fat or nonfat cheeses, including ricotta and mozzarella. Skim or 1% milk that is liquid, powdered, or evaporated. Buttermilk that is made with low-fatmilk. Nonfat or low-fat yogurt. Fats and oils Non-hydrogenated (trans-free) margarines. Vegetable oils, including soybean, sesame,   sunflower, olive, peanut, safflower, corn, canola, and cottonseed. Salad dressings or mayonnaisemade with a vegetable oil. Beverages Mineral water. Coffee and tea. Diet carbonated beverages. Sweets and desserts Sherbet, gelatin, and fruit ice. Small  amounts of dark chocolate. Limit all sweets and desserts. Seasonings and condiments All seasonings and condiments. The items listed above may not be a complete list of foods and drinks you can eat. Contact a dietitian for more options. What foods should I avoid? Fruits Canned fruit in heavy syrup. Fruit in cream or butter sauce. Fried fruit. Limitcoconut. Vegetables Vegetables cooked in cheese, cream, or butter sauce. Fried vegetables. Grains Breads that are made with saturated or trans fats, oils, or whole milk. Croissants. Sweet rolls. Donuts. High-fat crackers,such as cheese crackers. Meats and other proteins Fatty meats, such as hot dogs, ribs, sausage, bacon, rib-eye roast or steak. High-fat deli meats, such as salami and bologna. Caviar. Domestic duck andgoose. Organ meats, such as liver. Dairy Cream, sour cream, cream cheese, and creamed cottage cheese. Whole-milk cheeses. Whole or 2% milk that is liquid, evaporated, or condensed. Whole buttermilk. Cream sauce or high-fat cheese sauce. Yogurt that is made fromwhole milk. Fats and oils Meat fat, or shortening. Cocoa butter, hydrogenated oils, palm oil, coconut oil, palm kernel oil. Solid fats and shortenings, including bacon fat, salt pork, lard, and butter. Nondairy cream substitutes. Salad dressings with cheeseor sour cream. Beverages Regular sodas and juice drinks with added sugar. Sweets and desserts Frosting. Pudding. Cookies. Cakes. Pies. Milk chocolate or white chocolate.Buttered syrups. Full-fat ice cream or ice cream drinks. The items listed above may not be a complete list of foods and drinks to avoid. Contact a dietitian for more information. Summary Heart-healthy meal planning includes eating less unhealthy fats, eating more healthy fats, and making other changes in your diet. Eat a balanced diet. This includes fruits and vegetables, low-fat or nonfat dairy, lean protein, nuts and legumes, whole grains, and heart-healthy  oils and fats. This information is not intended to replace advice given to you by your health care provider. Make sure you discuss any questions you have with your healthcare provider. Document Revised: 02/19/2018 Document Reviewed: 01/23/2018 Elsevier Patient Education  2022 [employer]vier Inc.  

## 2021-08-03 NOTE — Assessment & Plan Note (Signed)
CBC and CMET today Encouraged weight loss as this can help reduce reflux symptoms Continue Omeprazole, refilled today

## 2022-02-07 ENCOUNTER — Other Ambulatory Visit: Payer: Self-pay | Admitting: Internal Medicine

## 2022-02-07 DIAGNOSIS — I10 Essential (primary) hypertension: Secondary | ICD-10-CM

## 2022-02-07 NOTE — Telephone Encounter (Signed)
Pt states she hasn't been taking this medication but wants to wait until appt on 02/12/22 to see if she needs it or not.   Requested Prescriptions  Pending Prescriptions Disp Refills   amLODipine (NORVASC) 5 MG tablet [Pharmacy Med Name: AMLODIPINE BESYLATE 5MG  TABLETS] 90 tablet 1    Sig: TAKE 1 TABLET(5 MG) BY MOUTH DAILY     Cardiovascular: Calcium Channel Blockers 2 Failed - 02/07/2022 10:36 AM      Failed - Valid encounter within last 6 months    Recent Outpatient Visits          6 months ago Gastroesophageal reflux disease without esophagitis   [facility] [place], Coralie Keens, NP   1 year ago Screening for colon cancer   [place], FNP   2 years ago Essential hypertension   [place], DO   2 years ago Mild intermittent asthma without complication   [facility] [employer], NP   3 years ago Gastroesophageal reflux disease without esophagitis   [facility] Merrilyn Puma, Jerrel Ivory, NP      Future Appointments            In 5 days Garnette Gunner, Coralie Keens, NP [facility] [place] BP in normal range    BP Readings from Last 1 Encounters:  08/02/21 (!) 120/59         Passed - Last Heart Rate in normal range    Pulse Readings from Last 1 Encounters:  07/18/20 83

## 2022-02-12 ENCOUNTER — Other Ambulatory Visit: Payer: Self-pay

## 2022-02-12 ENCOUNTER — Ambulatory Visit: Payer: BC Managed Care – PPO | Admitting: Internal Medicine

## 2022-02-12 ENCOUNTER — Encounter: Payer: Self-pay | Admitting: Internal Medicine

## 2022-02-12 VITALS — BP 160/102 | HR 83 | Ht 63.0 in | Wt 193.6 lb

## 2022-02-12 DIAGNOSIS — E6609 Other obesity due to excess calories: Secondary | ICD-10-CM

## 2022-02-12 DIAGNOSIS — Z1159 Encounter for screening for other viral diseases: Secondary | ICD-10-CM | POA: Diagnosis not present

## 2022-02-12 DIAGNOSIS — E034 Atrophy of thyroid (acquired): Secondary | ICD-10-CM | POA: Diagnosis not present

## 2022-02-12 DIAGNOSIS — Z1231 Encounter for screening mammogram for malignant neoplasm of breast: Secondary | ICD-10-CM | POA: Diagnosis not present

## 2022-02-12 DIAGNOSIS — I10 Essential (primary) hypertension: Secondary | ICD-10-CM

## 2022-02-12 DIAGNOSIS — Z6834 Body mass index (BMI) 34.0-34.9, adult: Secondary | ICD-10-CM

## 2022-02-12 DIAGNOSIS — Z23 Encounter for immunization: Secondary | ICD-10-CM

## 2022-02-12 DIAGNOSIS — Z0001 Encounter for general adult medical examination with abnormal findings: Secondary | ICD-10-CM | POA: Diagnosis not present

## 2022-02-12 MED ORDER — CLOBETASOL PROPIONATE 0.05 % EX SOLN: 1.0000 "application " | Freq: Two times a day (BID) | CUTANEOUS | 0 refills | Status: DC

## 2022-02-12 NOTE — Progress Notes (Signed)
Subjective:    Patient ID: Natalie Kidd, female    DOB: Dec 21, 1951, 71 y.o.   MRN: [id number]  HPI  Patient presents to clinic today for her annual exam.  Flu: never Tetanus: unsure Pneumovax: never Prevnar: never Covid: never Shingrix: never Pap smear: > 5 years  Mammogram: > 5 years Bone density: never Colon screening: 07/2020, Cologuard Vision screening: annually Dentist: biannually  Diet:  She does eat meat. She consumes fruits and veggies. She tries to avoid food. She drinks mostly water, coffee. Exercise: Walking  Review of Systems     No past medical history on file.  Current Outpatient Medications  Medication Sig Dispense Refill   Albuterol Sulfate (PROAIR RESPICLICK) 010 (90 Base) MCG/ACT AEPB Inhale 1-2 puffs into the lungs every 6 (six) hours as needed (Wheezing and shortness of breath). 1 each 2   amLODipine (NORVASC) 5 MG tablet TAKE 1 TABLET(5 MG) BY MOUTH DAILY 90 tablet 1   Ascorbic Acid (VITAMIN C) 100 MG tablet Take 100 mg by mouth daily.     fluticasone (FLONASE) 50 MCG/ACT nasal spray Place 2 sprays into both nostrils daily. 16 g 6   levothyroxine (SYNTHROID) 50 MCG tablet Take 1 tablet daily before breakfast 90 tablet 1   Multiple Vitamin (MULTIVITAMIN) tablet Take 1 tablet by mouth 2 (two) times daily.     omeprazole (PRILOSEC) 20 MG capsule TAKE 1 CAPSULE(20 MG) BY MOUTH TWICE DAILY BEFORE A MEAL 180 capsule 1   TRIAMCINOLONE PO Take by mouth.     No current facility-administered medications for this visit.    Allergies  Allergen Reactions   Bee Venom Swelling   Flagyl [Metronidazole] Other (See Comments)    Loss of motor skills   Penicillins    Tape Hives    Family History  Problem Relation Age of Onset   Heart disease Father    Kidney disease Father     Social History   Socioeconomic History   Marital status: Married    Spouse name: Not on file   Number of children: Not on file   Years of education: Not on file   Highest  education level: Not on file  Occupational History   Not on file  Tobacco Use   Smoking status: Never   Smokeless tobacco: Never  Vaping Use   Vaping Use: Never used  Substance and Sexual Activity   Alcohol use: No   Drug use: No   Sexual activity: Not on file  Other Topics Concern   Not on file  Social History Narrative   Not on file   Social Determinants of Health   Financial Resource Strain: Not on file  Food Insecurity: Not on file  Transportation Needs: Not on file  Physical Activity: Not on file  Stress: Not on file  Social Connections: Not on file  Intimate Partner Violence: Not on file     Constitutional: Denies fever, malaise, fatigue, headache or abrupt weight changes.  HEENT: Denies eye pain, eye redness, ear pain, ringing in the ears, wax buildup, runny nose, nasal congestion, bloody nose, or sore throat. Respiratory: Denies difficulty breathing, shortness of breath, cough or sputum production.   Cardiovascular: Denies chest pain, chest tightness, palpitations or swelling in the hands or feet.  Gastrointestinal: Pt reports intermittent reflux. Denies abdominal pain, bloating, constipation, diarrhea or blood in the stool.  GU: Denies urgency, frequency, pain with urination, burning sensation, blood in urine, odor or discharge. Musculoskeletal: Denies decrease in range of motion,  difficulty with gait, muscle pain or joint pain and swelling.  Skin: Denies redness, rashes, lesions or ulcercations.  Neurological: Denies dizziness, difficulty with memory, difficulty with speech or problems with balance and coordination.  Psych: Denies anxiety, depression, SI/HI.  No other specific complaints in a complete review of systems (except as listed in HPI above).  Objective:   Physical Exam   BP (!) 184/89    Pulse 83    Ht _0  (1.6 m)    Wt 193 lb 9.6 oz (87.8 kg)    SpO2 99%    BMI 34.29 kg/m  Wt Readings from Last 3 Encounters:  02/12/22 193 lb 9.6 oz (87.8 kg)   08/02/21 194 lb 12.8 oz (88.4 kg)  07/18/20 193 lb 3.2 oz (87.6 kg)    General: Appears her stated age, obese, in NAD. Skin: Warm, dry and intact.  HEENT: Head: normal shape and size; Eyes: sclera white and EOMs intact; Neck:  Neck supple, trachea midline. No masses, lumps or thyromegaly present.  Cardiovascular: Normal rate and rhythm. S1,S2 noted.  No murmur, rubs or gallops noted. No JVD or BLE edema. No carotid bruits noted. Pulmonary/Chest: Normal effort and positive vesicular breath sounds. No respiratory distress. No wheezes, rales or ronchi noted.  Abdomen: Normal bowel sounds.  Musculoskeletal: Strength 5/5 BUE/BLE.  No difficulty with gait.  Neurological: Alert and oriented. Cranial nerves II-XII grossly intact. Coordination normal.  Psychiatric: Mood and affect normal. Behavior is normal. Judgment and thought content normal.     BMET    Component Value Date/Time   NA 140 08/02/2021 1536   K 4.1 08/02/2021 1536   CL 106 08/02/2021 1536   CO2 25 08/02/2021 1536   GLUCOSE 124 08/02/2021 1536   BUN 17 08/02/2021 1536   CREATININE 1.05 (H) 08/02/2021 1536   CALCIUM 9.2 08/02/2021 1536   GFRNONAA 61 07/24/2020 0837   GFRAA 71 07/24/2020 0837    Lipid Panel     Component Value Date/Time   CHOL 179 08/02/2021 1536   TRIG 259 (H) 08/02/2021 1536   HDL 43 (L) 08/02/2021 1536   CHOLHDL 4.2 08/02/2021 1536   LDLCALC 99 08/02/2021 1536    CBC    Component Value Date/Time   WBC 9.2 08/02/2021 1536   RBC 4.97 08/02/2021 1536   HGB 14.5 08/02/2021 1536   HCT 44.3 08/02/2021 1536   PLT 327 08/02/2021 1536   MCV 89.1 08/02/2021 1536   MCH 29.2 08/02/2021 1536   MCHC 32.7 08/02/2021 1536   RDW 13.1 08/02/2021 1536   LYMPHSABS 1,702 07/24/2020 0837   EOSABS 179 07/24/2020 0837   BASOSABS 38 07/24/2020 0837    Hgb A1C No results found for: HGBA1C         Assessment & Plan:   Preventative Health Maintenance:  She declines flu shot Tdap today She declines  Pneumovax and Prevnar She declines Shingrix She declines COVID-vaccine She will no longer screening for cervical cancer Mammogram ordered-she will call to schedule She declines order for bone density at this time Cologuard UTD Encouraged her to consume a balanced diet and exercise regimen Advised her to see an eye doctor and dentist annually We will check CBC, c-Met, TSH, free T4, lipid, A1c and hep C today  RTC in 2 weeks, follow-up HTN  Webb Silversmith, NP  This visit occurred during the SARS-CoV-2 public health emergency.  Safety protocols were in place, including screening questions prior to the visit, additional usage of staff PPE, and extensive  cleaning of exam room while observing appropriate contact time as indicated for disinfecting solutions.  ° °

## 2022-02-13 LAB — HEPATITIS C ANTIBODY
Hepatitis C Ab: NONREACTIVE
SIGNAL TO CUT-OFF: 0.03 (ref <1.00)

## 2022-02-13 LAB — COMPLETE METABOLIC PANEL WITH GFR
AG Ratio: 1.7 (calc) (ref 1.0–2.5)
ALT: 60 U/L — ABNORMAL HIGH (ref 6–29)
AST: 43 U/L — ABNORMAL HIGH (ref 10–35)
Albumin: 4.5 g/dL (ref 3.6–5.1)
Alkaline phosphatase (APISO): 65 U/L (ref 37–153)
BUN: 19 mg/dL (ref 7–25)
CO2: 28 mmol/L (ref 20–32)
Calcium: 10.1 mg/dL (ref 8.6–10.4)
Chloride: 105 mmol/L (ref 98–110)
Creat: 0.97 mg/dL (ref 0.60–1.00)
Globulin: 2.7 g/dL (calc) (ref 1.9–3.7)
Glucose, Bld: 89 mg/dL (ref 65–139)
Potassium: 4.4 mmol/L (ref 3.5–5.3)
Sodium: 141 mmol/L (ref 135–146)
Total Bilirubin: 0.4 mg/dL (ref 0.2–1.2)
Total Protein: 7.2 g/dL (ref 6.1–8.1)
eGFR: 63 mL/min/{1.73_m2} (ref >=60)

## 2022-02-13 LAB — CBC
HCT: 44.9 % (ref 35.0–45.0)
Hemoglobin: 14.8 g/dL (ref 11.7–15.5)
MCH: 29 pg (ref 27.0–33.0)
MCHC: 33 g/dL (ref 32.0–36.0)
MCV: 87.9 fL (ref 80.0–100.0)
MPV: 10.2 fL (ref 7.5–12.5)
Platelets: 307 10*3/uL (ref 140–400)
RBC: 5.11 10*6/uL — ABNORMAL HIGH (ref 3.80–5.10)
RDW: 13.3 % (ref 11.0–15.0)
WBC: 8.7 10*3/uL (ref 3.8–10.8)

## 2022-02-13 LAB — LIPID PANEL
Cholesterol: 179 mg/dL (ref <200)
HDL: 44 mg/dL — ABNORMAL LOW (ref >=50)
LDL Cholesterol (Calc): 109 mg/dL (calc) — ABNORMAL HIGH
Non-HDL Cholesterol (Calc): 135 mg/dL (calc) — ABNORMAL HIGH (ref <130)
Total CHOL/HDL Ratio: 4.1 (calc) (ref <5.0)
Triglycerides: 151 mg/dL — ABNORMAL HIGH (ref <150)

## 2022-02-13 LAB — HEMOGLOBIN A1C
Hgb A1c MFr Bld: 5.8 % of total Hgb — ABNORMAL HIGH (ref <5.7)
Mean Plasma Glucose: 120 mg/dL
eAG (mmol/L): 6.6 mmol/L

## 2022-02-13 LAB — TSH+FREE T4: TSH W/REFLEX TO FT4: 1.62 mIU/L (ref 0.40–4.50)

## 2022-02-14 NOTE — Assessment & Plan Note (Signed)
Uncontrolled but she admits not taking her Amlodipine Advised her to take this consistently daily for the next 2 weeks Reinforced DASH diet and exercise for weight loss  RTC in 2 weeks for BP check

## 2022-02-14 NOTE — Assessment & Plan Note (Signed)
Encourage diet and exercise for weight loss 

## 2022-02-14 NOTE — Patient Instructions (Signed)
Health Maintenance After Age 71 After age 71, you are at a higher risk for certain long-term diseases and infections as well as injuries from falls. Falls are a major cause of broken bones and head injuries in people who are older than age 71. Getting regular preventive care can help to keep you healthy and well. Preventive care includes getting regular testing and making lifestyle changes as recommended by your health care provider. Talk with your health care provider about: Which screenings and tests you should have. A screening is a test that checks for a disease when you have no symptoms. A diet and exercise plan that is right for you. What should I know about screenings and tests to prevent falls? Screening and testing are the best ways to find a health problem early. Early diagnosis and treatment give you the best chance of managing medical conditions that are common after age 71. Certain conditions and lifestyle choices may make you more likely to have a fall. Your health care provider may recommend: Regular vision checks. Poor vision and conditions such as cataracts can make you more likely to have a fall. If you wear glasses, make sure to get your prescription updated if your vision changes. Medicine review. Work with your health care provider to regularly review all of the medicines you are taking, including over-the-counter medicines. Ask your health care provider about any side effects that may make you more likely to have a fall. Tell your health care provider if any medicines that you take make you feel dizzy or sleepy. Strength and balance checks. Your health care provider may recommend certain tests to check your strength and balance while standing, walking, or changing positions. Foot health exam. Foot pain and numbness, as well as not wearing proper footwear, can make you more likely to have a fall. Screenings, including: Osteoporosis screening. Osteoporosis is a condition that causes  the bones to get weaker and break more easily. Blood pressure screening. Blood pressure changes and medicines to control blood pressure can make you feel dizzy. Depression screening. You may be more likely to have a fall if you have a fear of falling, feel depressed, or feel unable to do activities that you used to do. Alcohol use screening. Using too much alcohol can affect your balance and may make you more likely to have a fall. Follow these instructions at home: Lifestyle Do not drink alcohol if: Your health care provider tells you not to drink. If you drink alcohol: Limit how much you have to: 0-1 drink a day for women. 0-2 drinks a day for men. Know how much alcohol is in your drink. In the [place] one drink equals one 12 oz bottle of beer (355 mL), one 5 oz glass of wine (148 mL), or one 1 oz glass of hard liquor (44 mL). Do not use any products that contain nicotine or tobacco. These products include cigarettes, chewing tobacco, and vaping devices, such as e-cigarettes. If you need help quitting, ask your health care provider. Activity  Follow a regular exercise program to stay fit. This will help you maintain your balance. Ask your health care provider what types of exercise are appropriate for you. If you need a cane or walker, use it as recommended by your health care provider. Wear supportive shoes that have nonskid soles. Safety  Remove any tripping hazards, such as rugs, cords, and clutter. Install safety equipment such as grab bars in bathrooms and safety rails on stairs. Keep rooms and walkways   well-lit. General instructions Talk with your health care provider about your risks for falling. Tell your health care provider if: You fall. Be sure to tell your health care provider about all falls, even ones that seem minor. You feel dizzy, tiredness (fatigue), or off-balance. Take over-the-counter and prescription medicines only as told by your health care provider. These include  supplements. Eat a healthy diet and maintain a healthy weight. A healthy diet includes low-fat dairy products, low-fat (lean) meats, and fiber from whole grains, beans, and lots of fruits and vegetables. Stay current with your vaccines. Schedule regular health, dental, and eye exams. Summary Having a healthy lifestyle and getting preventive care can help to protect your health and wellness after age 71. Screening and testing are the best way to find a health problem early and help you avoid having a fall. Early diagnosis and treatment give you the best chance for managing medical conditions that are more common for people who are older than age 71. Falls are a major cause of broken bones and head injuries in people who are older than age 71. Take precautions to prevent a fall at home. Work with your health care provider to learn what changes you can make to improve your health and wellness and to prevent falls. This information is not intended to replace advice given to you by your health care provider. Make sure you discuss any questions you have with your health care provider. Document Revised: 05/07/2021 Document Reviewed: 05/07/2021 Elsevier Patient Education  2022 Elsevier Inc.  

## 2022-02-16 ENCOUNTER — Encounter: Payer: Self-pay | Admitting: Internal Medicine

## 2022-02-16 DIAGNOSIS — K219 Gastro-esophageal reflux disease without esophagitis: Secondary | ICD-10-CM

## 2022-02-16 DIAGNOSIS — E034 Atrophy of thyroid (acquired): Secondary | ICD-10-CM

## 2022-02-18 MED ORDER — LEVOTHYROXINE SODIUM 50 MCG PO TABS: ORAL_TABLET | ORAL | 1 refills | Status: DC

## 2022-02-18 MED ORDER — OMEPRAZOLE 20 MG PO CPDR: DELAYED_RELEASE_CAPSULE | ORAL | 1 refills | Status: DC

## 2022-03-21 ENCOUNTER — Ambulatory Visit
Admission: RE | Admit: 2022-03-21 | Discharge: 2022-03-21 | Disposition: A | Discharge status: Home / Self Care | Payer: BC Managed Care – PPO | Source: Ambulatory Visit | Attending: Internal Medicine | Admitting: Internal Medicine

## 2022-03-21 ENCOUNTER — Other Ambulatory Visit: Payer: Self-pay

## 2022-03-21 DIAGNOSIS — Z1231 Encounter for screening mammogram for malignant neoplasm of breast: Secondary | ICD-10-CM | POA: Diagnosis present

## 2022-04-05 ENCOUNTER — Other Ambulatory Visit: Payer: Self-pay | Admitting: Internal Medicine

## 2022-04-05 DIAGNOSIS — E034 Atrophy of thyroid (acquired): Secondary | ICD-10-CM

## 2022-04-05 NOTE — Telephone Encounter (Signed)
Has newer rx at same pharm with refills. ?Requested Prescriptions  ?Pending Prescriptions Disp Refills  ?? levothyroxine (SYNTHROID) 50 MCG tablet [Pharmacy Med Name: LEVOTHYROXINE 0.05MG  (50MCG) TAB] 90 tablet 1  ?  Sig: TAKE 1 TABLET BY MOUTH DAILY BEFORE AND BREAKFAST  ?  ? Endocrinology:  Hypothyroid Agents Passed - 04/05/2022  3:17 AM  ?  ?  Passed - TSH in normal range and within 360 days  ?  TSH  ?Date Value Ref Range Status  ?08/02/2021 2.02 0.40 - 4.50 mIU/L Final  ?   ?  ?  Passed - Valid encounter within last 12 months  ?  Recent Outpatient Visits   ?      ? 1 month ago Encounter for general adult medical examination with abnormal findings  ? [facility] [place], [place], NP  ? 8 months ago Gastroesophageal reflux disease without esophagitis  ? [facility] [place], Coralie Keens, NP  ? 1 year ago Screening for colon cancer  ? [place], FNP  ? 2 years ago Essential hypertension  ? [place], DO  ? 2 years ago Mild intermittent asthma without complication  ? [facility] Merrilyn [place], Jerrel Ivory, NP  ?  ?  ?Future Appointments   ?        ? In 4 months Baity, Coralie Keens, NP [facility], [place]  ?  ? ?  ?  ?  ? ? ?

## 2022-04-10 ENCOUNTER — Encounter: Payer: Self-pay | Admitting: Internal Medicine

## 2022-04-10 DIAGNOSIS — I10 Essential (primary) hypertension: Secondary | ICD-10-CM

## 2022-04-10 MED ORDER — AMLODIPINE BESYLATE 5 MG PO TABS: ORAL_TABLET | ORAL | 1 refills | Status: DC

## 2022-04-10 NOTE — Telephone Encounter (Signed)
amLODipine (NORVASC) 5 MG tablet 90 tablet 1 04/10/2022    ?Sig: TAKE 1 TABLET(5 MG) BY MOUTH DAILY   ?Sent to pharmacy as: amLODipine (NORVASC) 5 MG tablet   ?E-Prescribing Status: Receipt confirmed by pharmacy (04/10/2022  4:31 PM EDT)   ? ?

## 2022-07-29 ENCOUNTER — Other Ambulatory Visit: Payer: Self-pay | Admitting: Internal Medicine

## 2022-07-29 DIAGNOSIS — E034 Atrophy of thyroid (acquired): Secondary | ICD-10-CM

## 2022-07-30 NOTE — Telephone Encounter (Signed)
Requested Prescriptions  Pending Prescriptions Disp Refills  . levothyroxine (SYNTHROID) 50 MCG tablet [Pharmacy Med Name: LEVOTHYROXINE SODIUM 50 MCG TAB] 90 tablet 0    Sig: TAKE 1 TABLET BY MOUTH ONCE DAILY ON AN EMPTY STOMACH. WAIT 30 MINUTES BEFORE TAKING OTHER MEDS.     Endocrinology:  Hypothyroid Agents Passed - 07/29/2022 10:11 AM      Passed - TSH in normal range and within 360 days    TSH  Date Value Ref Range Status  08/02/2021 2.02 0.40 - 4.50 mIU/L Final         Passed - Valid encounter within last 12 months    Recent Outpatient Visits          5 months ago Encounter for general adult medical examination with abnormal findings   [facility] [name], [name], NP   12 months ago Gastroesophageal reflux disease without esophagitis   [facility] [name], [name], NP   2 years ago Screening for colon cancer   [facility], [name], FNP   2 years ago Essential hypertension   [facility] [name], [name], DO   2 years ago Mild intermittent asthma without complication   [facility] [name], NP      Future Appointments            In 2 weeks [name], [name], NP [facility], [facility]

## 2022-08-12 ENCOUNTER — Ambulatory Visit: Payer: BC Managed Care – PPO | Admitting: Internal Medicine

## 2022-08-19 ENCOUNTER — Ambulatory Visit: Payer: BC Managed Care – PPO | Admitting: Internal Medicine

## 2022-08-19 ENCOUNTER — Encounter: Payer: Self-pay | Admitting: Internal Medicine

## 2022-08-19 VITALS — BP 139/79 | HR 87 | Temp 96.9°F | Wt 193.0 lb

## 2022-08-19 DIAGNOSIS — E781 Pure hyperglyceridemia: Secondary | ICD-10-CM

## 2022-08-19 DIAGNOSIS — R7303 Prediabetes: Secondary | ICD-10-CM

## 2022-08-19 DIAGNOSIS — I10 Essential (primary) hypertension: Secondary | ICD-10-CM

## 2022-08-19 DIAGNOSIS — J4520 Mild intermittent asthma, uncomplicated: Secondary | ICD-10-CM

## 2022-08-19 DIAGNOSIS — Z6834 Body mass index (BMI) 34.0-34.9, adult: Secondary | ICD-10-CM

## 2022-08-19 DIAGNOSIS — K219 Gastro-esophageal reflux disease without esophagitis: Secondary | ICD-10-CM

## 2022-08-19 DIAGNOSIS — E034 Atrophy of thyroid (acquired): Secondary | ICD-10-CM | POA: Diagnosis not present

## 2022-08-19 DIAGNOSIS — E6609 Other obesity due to excess calories: Secondary | ICD-10-CM

## 2022-08-19 MED ORDER — AMLODIPINE BESYLATE 10 MG PO TABS: 10.0000 mg | ORAL_TABLET | Freq: Every day | ORAL | 0 refills | Status: DC

## 2022-08-19 MED ORDER — PROAIR RESPICLICK 108 (90 BASE) MCG/ACT IN AEPB: 1.0000 | INHALATION_SPRAY | Freq: Four times a day (QID) | RESPIRATORY_TRACT | 2 refills | Status: DC | PRN

## 2022-08-19 NOTE — Assessment & Plan Note (Signed)
She would like to try coming off levothyroxine, advised her to go ahead and stop this We will check TSH and free T4 in 1 month, lab only

## 2022-08-19 NOTE — Assessment & Plan Note (Signed)
Encouraged weight loss as this can help reduce reflux symptoms Avoid triggers Continue omeprazole

## 2022-08-19 NOTE — Assessment & Plan Note (Signed)
C-Met and lipid profile today Encouraged her to consume low-fat diet 

## 2022-08-19 NOTE — Progress Notes (Signed)
Subjective:    Patient ID: Natalie Kidd, female    DOB: 1951/01/07, 71 y.o.   MRN: [id number]  HPI  Patient presents to clinic today for 23-month follow-up of chronic conditions.  HTN: Her BP today is 146/84.  She is taking Amlodipine as prescribed.  ECG from 11/2018 reviewed.  GERD: Triggered by eating late at night.  She denies breakthrough on Omeprazole.  There is no upper GI on file.  Hypothyroidism: She denies any issues on her current dose of Levothyroxine.  She does not follow with endocrinology.  HLD: Her last LDL was 109, triglycerides 151, 01/2022.  She is not taking any cholesterol-lowering medication at this time.  She tries to consume a low-fat diet.  Prediabetes: Her last A1c was 5.8%, 01/2022.  She is not taking any oral diabetic medication at this time.  She does not check her sugars.  Review of Systems     No past medical history on file.  Current Outpatient Medications  Medication Sig Dispense Refill   Albuterol Sulfate (PROAIR RESPICLICK) 108 (90 Base) MCG/ACT AEPB Inhale 1-2 puffs into the lungs every 6 (six) hours as needed (Wheezing and shortness of breath). 1 each 2   amLODipine (NORVASC) 5 MG tablet TAKE 1 TABLET(5 MG) BY MOUTH DAILY 90 tablet 1   Ascorbic Acid (VITAMIN C) 100 MG tablet Take 100 mg by mouth daily.     clobetasol (TEMOVATE) 0.05 % external solution Apply 1 application topically 2 (two) times daily. 50 mL 0   fluticasone (FLONASE) 50 MCG/ACT nasal spray Place 2 sprays into both nostrils daily. 16 g 6   levothyroxine (SYNTHROID) 50 MCG tablet TAKE 1 TABLET BY MOUTH ONCE DAILY ON Kidd EMPTY STOMACH. WAIT 30 MINUTES BEFORE TAKING OTHER MEDS. 90 tablet 0   Multiple Vitamin (MULTIVITAMIN) tablet Take 1 tablet by mouth 2 (two) times daily.     omeprazole (PRILOSEC) 20 MG capsule TAKE 1 CAPSULE(20 MG) BY MOUTH TWICE DAILY BEFORE A MEAL 180 capsule 1   TRIAMCINOLONE PO Take by mouth.     No current facility-administered medications for this visit.     Allergies  Allergen Reactions   Bee Venom Swelling   Flagyl [Metronidazole] Other (See Comments)    Loss of motor skills   Penicillins    Tape Hives    Family History  Problem Relation Age of Onset   Heart disease Father    Kidney disease Father    Breast cancer Neg Hx     Social History   Socioeconomic History   Marital status: Married    Spouse name: Not on file   Number of children: Not on file   Years of education: Not on file   Highest education level: Not on file  Occupational History   Not on file  Tobacco Use   Smoking status: Never   Smokeless tobacco: Never  Vaping Use   Vaping Use: Never used  Substance and Sexual Activity   Alcohol use: No   Drug use: No   Sexual activity: Not on file  Other Topics Concern   Not on file  Social History Narrative   Not on file   Social Determinants of Health   Financial Resource Strain: Not on file  Food Insecurity: Not on file  Transportation Needs: Not on file  Physical Activity: Not on file  Stress: Not on file  Social Connections: Not on file  Intimate Partner Violence: Not on file     Constitutional: Denies fever, malaise,  fatigue, headache or abrupt weight changes.  HEENT: Denies eye pain, eye redness, ear pain, ringing in the ears, wax buildup, runny nose, nasal congestion, bloody nose, or sore throat. Respiratory: Denies difficulty breathing, shortness of breath, cough or sputum production.   Cardiovascular: Denies chest pain, chest tightness, palpitations or swelling in the hands or feet.  Gastrointestinal: Denies abdominal pain, bloating, constipation, diarrhea or blood in the stool.  GU: Denies urgency, frequency, pain with urination, burning sensation, blood in urine, odor or discharge. Musculoskeletal: Denies decrease in range of motion, difficulty with gait, muscle pain or joint pain and swelling.  Skin: Denies redness, rashes, lesions or ulcercations.  Neurological: Denies dizziness,  difficulty with memory, difficulty with speech or problems with balance and coordination.  Psych: Denies anxiety, depression, SI/HI.  No other specific complaints in a complete review of systems (except as listed in HPI above).  Objective:   Physical Exam  BP (!) 146/84 (BP Location: Left Arm, Patient Position: Sitting, Cuff Size: Normal)   Pulse 87   Temp (!) 96.9 F (36.1 C) (Temporal)   Wt 193 lb (87.5 kg)   SpO2 99%   BMI 34.19 kg/m   Wt Readings from Last 3 Encounters:  02/12/22 193 lb 9.6 oz (87.8 kg)  08/02/21 194 lb 12.8 oz (88.4 kg)  07/18/20 193 lb 3.2 oz (87.6 kg)    General: Appears her stated age, obese, in NAD. Skin: Warm, dry and intact.  HEENT: Head: normal shape and size; Eyes: sclera white, no icterus, conjunctiva pink, PERRLA and EOMs intact;  Neck:  Neck supple, trachea midline. No masses, lumps or thyromegaly present.  Cardiovascular: Normal rate and rhythm. S1,S2 noted.  No murmur, rubs or gallops noted. No JVD or BLE edema. No carotid bruits noted. Pulmonary/Chest: Normal effort and positive vesicular breath sounds. No respiratory distress. No wheezes, rales or ronchi noted.  Abdomen:Normal bowel sounds. Musculoskeletal: No difficulty with gait.  Neurological: Alert and oriented.   BMET    Component Value Date/Time   NA 141 02/12/2022 1420   K 4.4 02/12/2022 1420   CL 105 02/12/2022 1420   CO2 28 02/12/2022 1420   GLUCOSE 89 02/12/2022 1420   BUN 19 02/12/2022 1420   CREATININE 0.97 02/12/2022 1420   CALCIUM 10.1 02/12/2022 1420   GFRNONAA 61 07/24/2020 0837   GFRAA 71 07/24/2020 0837    Lipid Panel     Component Value Date/Time   CHOL 179 02/12/2022 1420   TRIG 151 (H) 02/12/2022 1420   HDL 44 (L) 02/12/2022 1420   CHOLHDL 4.1 02/12/2022 1420   LDLCALC 109 (H) 02/12/2022 1420    CBC    Component Value Date/Time   WBC 8.7 02/12/2022 1420   RBC 5.11 (H) 02/12/2022 1420   HGB 14.8 02/12/2022 1420   HCT 44.9 02/12/2022 1420   PLT  307 02/12/2022 1420   MCV 87.9 02/12/2022 1420   MCH 29.0 02/12/2022 1420   MCHC 33.0 02/12/2022 1420   RDW 13.3 02/12/2022 1420   LYMPHSABS 1,702 07/24/2020 0837   EOSABS 179 07/24/2020 0837   BASOSABS 38 07/24/2020 0837    Hgb A1C Lab Results  Component Value Date   HGBA1C 5.8 (H) 02/12/2022            Assessment & Plan:     RTC in 1 month for nurse/lab visit, 6 months, follow-up chronic conditions Nicki Reaper, NP

## 2022-08-19 NOTE — Assessment & Plan Note (Signed)
A1c today Encourage low-carb diet and exercise for weight loss 

## 2022-08-19 NOTE — Assessment & Plan Note (Signed)
Encourage diet and exercise weight loss 

## 2022-08-19 NOTE — Patient Instructions (Signed)

## 2022-08-19 NOTE — Assessment & Plan Note (Signed)
Elevated but manual repeat. We will increase amlodipine to 10 mg daily Reinforced DASH diet and exercise weight loss CMET in 1 month, lab only

## 2022-09-18 ENCOUNTER — Other Ambulatory Visit: Payer: Self-pay

## 2022-09-18 DIAGNOSIS — R7303 Prediabetes: Secondary | ICD-10-CM

## 2022-09-18 DIAGNOSIS — E034 Atrophy of thyroid (acquired): Secondary | ICD-10-CM

## 2022-09-18 DIAGNOSIS — E781 Pure hyperglyceridemia: Secondary | ICD-10-CM

## 2022-09-19 ENCOUNTER — Ambulatory Visit: Payer: BC Managed Care – PPO

## 2022-09-19 ENCOUNTER — Other Ambulatory Visit: Payer: BC Managed Care – PPO

## 2022-09-19 NOTE — Progress Notes (Signed)
Hypertension, follow-up  BP Readings from Last 3 Encounters:  08/19/22 139/79  02/12/22 (!) 160/102  08/02/21 (!) 120/59   Wt Readings from Last 3 Encounters:  08/19/22 193 lb (87.5 kg)  02/12/22 193 lb 9.6 oz (87.8 kg)  08/02/21 194 lb 12.8 oz (88.4 kg)     She was last seen for hypertension 08/19/2022 BP at that visit was 139/79. Management since that visit includes increasing amlodipine to 10mg  daily.  She reports excellent compliance with treatment. She is not having side effects.   Pt states she did not take amlodipine until a few minutes before her appointment.  She was also rushing around this morning.  She is going to check her BP at home and come back next week to recheck again.      148/82 Left arm 142/69 Right arm

## 2022-09-19 NOTE — Progress Notes (Signed)
noted 

## 2022-09-20 LAB — LIPID PANEL
Cholesterol: 183 mg/dL (ref <200)
HDL: 45 mg/dL — ABNORMAL LOW (ref >=50)
LDL Cholesterol (Calc): 113 mg/dL (calc) — ABNORMAL HIGH
Non-HDL Cholesterol (Calc): 138 mg/dL (calc) — ABNORMAL HIGH (ref <130)
Total CHOL/HDL Ratio: 4.1 (calc) (ref <5.0)
Triglycerides: 141 mg/dL (ref <150)

## 2022-09-20 LAB — COMPLETE METABOLIC PANEL WITH GFR
AG Ratio: 1.7 (calc) (ref 1.0–2.5)
ALT: 59 U/L — ABNORMAL HIGH (ref 6–29)
AST: 46 U/L — ABNORMAL HIGH (ref 10–35)
Albumin: 4.6 g/dL (ref 3.6–5.1)
Alkaline phosphatase (APISO): 74 U/L (ref 37–153)
BUN: 18 mg/dL (ref 7–25)
CO2: 28 mmol/L (ref 20–32)
Calcium: 9.6 mg/dL (ref 8.6–10.4)
Chloride: 103 mmol/L (ref 98–110)
Creat: 0.97 mg/dL (ref 0.60–1.00)
Globulin: 2.7 g/dL (calc) (ref 1.9–3.7)
Glucose, Bld: 97 mg/dL (ref 65–99)
Potassium: 4.2 mmol/L (ref 3.5–5.3)
Sodium: 141 mmol/L (ref 135–146)
Total Bilirubin: 0.4 mg/dL (ref 0.2–1.2)
Total Protein: 7.3 g/dL (ref 6.1–8.1)
eGFR: 62 mL/min/{1.73_m2} (ref >=60)

## 2022-09-20 LAB — HEMOGLOBIN A1C
Hgb A1c MFr Bld: 5.8 % of total Hgb — ABNORMAL HIGH (ref <5.7)
Mean Plasma Glucose: 120 mg/dL
eAG (mmol/L): 6.6 mmol/L

## 2022-09-20 LAB — T4, FREE: Free T4: 1 ng/dL (ref 0.8–1.8)

## 2022-09-20 LAB — TSH: TSH: 5.83 mIU/L — ABNORMAL HIGH (ref 0.40–4.50)

## 2022-10-01 ENCOUNTER — Ambulatory Visit: Payer: BC Managed Care – PPO

## 2022-10-01 NOTE — Progress Notes (Unsigned)
Hypertension, follow-up  BP Readings from Last 3 Encounters:  09/19/22 (!) 142/69  08/19/22 139/79  02/12/22 (!) 160/102   Wt Readings from Last 3 Encounters:  08/19/22 193 lb (87.5 kg)  02/12/22 193 lb 9.6 oz (87.8 kg)  08/02/21 194 lb 12.8 oz (88.4 kg)     She was last seen for hypertension 09/19/2020. BP at that visit was 142/69. Management since that visit includes .  She reports excellent compliance with treatment. She is not having side effects.    146/82 left arm  144/82 right arm  [phone]

## 2022-10-02 ENCOUNTER — Other Ambulatory Visit: Payer: Self-pay | Admitting: Internal Medicine

## 2022-10-02 DIAGNOSIS — K219 Gastro-esophageal reflux disease without esophagitis: Secondary | ICD-10-CM

## 2022-10-02 MED ORDER — OMEPRAZOLE 20 MG PO CPDR: DELAYED_RELEASE_CAPSULE | ORAL | 3 refills | Status: DC

## 2022-10-02 MED ORDER — AMLODIPINE BESYLATE 10 MG PO TABS: 10.0000 mg | ORAL_TABLET | Freq: Every day | ORAL | 1 refills | Status: DC

## 2022-10-02 NOTE — Telephone Encounter (Signed)
Requested Prescriptions  Pending Prescriptions Disp Refills  . amLODipine (NORVASC) 10 MG tablet 90 tablet 0    Sig: Take 1 tablet (10 mg total) by mouth daily.     Cardiovascular: Calcium Channel Blockers 2 Failed - 10/02/2022 11:12 AM      Failed - Last BP in normal range    BP Readings from Last 1 Encounters:  09/19/22 (!) 142/69         Passed - Last Heart Rate in normal range    Pulse Readings from Last 1 Encounters:  08/19/22 87         Passed - Valid encounter within last 6 months    Recent Outpatient Visits          1 month ago Hypothyroidism due to acquired atrophy of thyroid   [place], Coralie Keens, NP   7 months ago Encounter for general adult medical examination with abnormal findings   [facility] [place], Coralie Keens, NP   1 year ago Gastroesophageal reflux disease without esophagitis   [facility] [place], Coralie Keens, NP   2 years ago Screening for colon cancer   [place], FNP   2 years ago Essential hypertension   [facility], Devonne Doughty, DO             . omeprazole (PRILOSEC) 20 MG capsule 180 capsule 1    Sig: TAKE 1 CAPSULE(20 MG) BY MOUTH TWICE DAILY BEFORE A MEAL     Gastroenterology: Proton Pump Inhibitors Passed - 10/02/2022 11:12 AM      Passed - Valid encounter within last 12 months    Recent Outpatient Visits          1 month ago Hypothyroidism due to acquired atrophy of thyroid   [place], Coralie Keens, NP   7 months ago Encounter for general adult medical examination with abnormal findings   [facility] [place], Coralie Keens, NP   1 year ago Gastroesophageal reflux disease without esophagitis   [facility] [place], Coralie Keens, NP   2 years ago Screening for colon cancer   [facility], Lupita Raider, FNP   2 years ago Essential hypertension   [place], Devonne Doughty, DO

## 2022-10-02 NOTE — Telephone Encounter (Signed)
Medication Refill - Medication: amLODipine (NORVASC) 10 MG tablet, omeprazole (PRILOSEC) 20 MG capsule  Has the patient contacted their pharmacy? Yes.     Preferred Pharmacy (with phone number or street name):  [place], [place]. Phone:  [phone]  Fax:  [phone]     Has the patient been seen for an appointment in the last year OR does the patient have an upcoming appointment? Yes.    Please assist patient further

## 2022-10-03 MED ORDER — LISINOPRIL 5 MG PO TABS: 5.0000 mg | ORAL_TABLET | Freq: Every day | ORAL | 1 refills | Status: DC

## 2022-10-03 NOTE — Addendum Note (Signed)
Addended by: Ashley Royalty E on: 10/03/2022 10:49 AM   Modules accepted: Orders

## 2022-10-03 NOTE — Addendum Note (Signed)
Addended by: Jearld Fenton on: 10/03/2022 10:55 AM   Modules accepted: Orders

## 2022-10-03 NOTE — Progress Notes (Signed)
Pt agreed to adding lisinopril.  Please send to [place].

## 2022-10-03 NOTE — Progress Notes (Signed)
Remains slightly elevated.  Would she be willing to add lisinopril 5 mg for better blood pressure control? Webb Silversmith, NP

## 2022-12-19 ENCOUNTER — Other Ambulatory Visit: Payer: Self-pay | Admitting: Internal Medicine

## 2022-12-20 NOTE — Telephone Encounter (Signed)
Requested Prescriptions  Pending Prescriptions Disp Refills   lisinopril (ZESTRIL) 5 MG tablet [Pharmacy Med Name: LISINOPRIL 5 MG TAB] 90 tablet 0    Sig: TAKE 1 TABLET BY MOUTH ONCE DAILY     Cardiovascular:  ACE Inhibitors Failed - 12/19/2022 11:17 AM      Failed - Last BP in normal range    BP Readings from Last 1 Encounters:  10/02/22 (!) 144/82         Passed - Cr in normal range and within 180 days    Creat  Date Value Ref Range Status  09/19/2022 0.97 0.60 - 1.00 mg/dL Final         Passed - K in normal range and within 180 days    Potassium  Date Value Ref Range Status  09/19/2022 4.2 3.5 - 5.3 mmol/L Final         Passed - Patient is not pregnant      Passed - Valid encounter within last 6 months    Recent Outpatient Visits           4 months ago Hypothyroidism due to acquired atrophy of thyroid   [facility] [place], [name], NP   10 months ago Encounter for general adult medical examination with abnormal findings   [facility] [place], [name], NP   1 year ago Gastroesophageal reflux disease without esophagitis   [facility] [place], [name], NP   2 years ago Screening for colon cancer   [facility], [name], FNP   2 years ago Essential hypertension   [facility], [name], DO

## 2023-03-19 ENCOUNTER — Other Ambulatory Visit: Payer: Self-pay | Admitting: Internal Medicine

## 2023-03-19 ENCOUNTER — Encounter: Payer: Self-pay | Admitting: Internal Medicine

## 2023-03-19 ENCOUNTER — Ambulatory Visit: Payer: BC Managed Care – PPO | Admitting: Internal Medicine

## 2023-03-19 VITALS — BP 128/70 | HR 81 | Temp 96.8°F | Ht 63.5 in | Wt 192.0 lb

## 2023-03-19 DIAGNOSIS — Z0001 Encounter for general adult medical examination with abnormal findings: Secondary | ICD-10-CM | POA: Diagnosis not present

## 2023-03-19 DIAGNOSIS — Z1231 Encounter for screening mammogram for malignant neoplasm of breast: Secondary | ICD-10-CM

## 2023-03-19 DIAGNOSIS — E034 Atrophy of thyroid (acquired): Secondary | ICD-10-CM

## 2023-03-19 DIAGNOSIS — J069 Acute upper respiratory infection, unspecified: Secondary | ICD-10-CM

## 2023-03-19 DIAGNOSIS — R7303 Prediabetes: Secondary | ICD-10-CM

## 2023-03-19 DIAGNOSIS — E781 Pure hyperglyceridemia: Secondary | ICD-10-CM

## 2023-03-19 MED ORDER — FLUTICASONE PROPIONATE 50 MCG/ACT NA SUSP: 2.0000 | Freq: Every day | NASAL | 6 refills | Status: AC

## 2023-03-19 NOTE — Progress Notes (Signed)
Subjective:    Patient ID: Natalie Kidd, female    DOB: [birth year], 72 y.o.   MRN: [id number]  HPI  Patient presents to clinic today for her annual exam.  Flu: Never Tetanus: 01/2012 COVID: Never Pneumovax: Never Prevnar: Never Shingrix: Never Pap smear: No longer screening Mammogram: 02/2022 Bone Density: Never Colon screening: Cologuard, 07/2020 Vision screening: annually Dentist: biannually  Diet: She does eat meat. She consumes fruits and veggies. She tries to avoid fried foods. She drinks mostly water, coffee. Exercise: Walking   Review of Systems     No past medical history on file.  Current Outpatient Medications  Medication Sig Dispense Refill   Albuterol Sulfate (PROAIR RESPICLICK) [id number] (90 Base) MCG/ACT AEPB Inhale 1-2 puffs into the lungs every 6 (six) hours as needed (Wheezing and shortness of breath). 1 each 2   amLODipine (NORVASC) 10 MG tablet Take 1 tablet (10 mg total) by mouth daily. 90 tablet 1   Ascorbic Acid (VITAMIN C) 100 MG tablet Take 100 mg by mouth daily.     fluticasone (FLONASE) 50 MCG/ACT nasal spray Place 2 sprays into both nostrils daily. 16 g 6   levothyroxine (SYNTHROID) 50 MCG tablet TAKE 1 TABLET BY MOUTH ONCE DAILY ON AN EMPTY STOMACH. WAIT 30 MINUTES BEFORE TAKING OTHER MEDS. 90 tablet 0   lisinopril (ZESTRIL) 5 MG tablet TAKE 1 TABLET BY MOUTH ONCE DAILY 90 tablet 0   Multiple Vitamin (MULTIVITAMIN) tablet Take 1 tablet by mouth 2 (two) times daily.     omeprazole (PRILOSEC) 20 MG capsule TAKE 1 CAPSULE(20 MG) BY MOUTH TWICE DAILY BEFORE A MEAL 180 capsule 3   TRIAMCINOLONE PO Take by mouth.     No current facility-administered medications for this visit.    Allergies  Allergen Reactions   Bee Venom Swelling   Flagyl [Metronidazole] Other (See Comments)    Loss of motor skills   Penicillins    Tape Hives    Family History  Problem Relation Age of Onset   Heart disease Father    Kidney disease Father    Breast cancer Neg  Hx     Social History   Socioeconomic History   Marital status: Married    Spouse name: Not on file   Number of children: Not on file   Years of education: Not on file   Highest education level: Not on file  Occupational History   Not on file  Tobacco Use   Smoking status: Never   Smokeless tobacco: Never  Vaping Use   Vaping Use: Never used  Substance and Sexual Activity   Alcohol use: No   Drug use: No   Sexual activity: Not on file  Other Topics Concern   Not on file  Social History Narrative   Not on file   Social Determinants of Health   Financial Resource Strain: Not on file  Food Insecurity: Not on file  Transportation Needs: Not on file  Physical Activity: Not on file  Stress: Not on file  Social Connections: Not on file  Intimate Partner Violence: Not on file     Constitutional: Denies fever, malaise, fatigue, headache or abrupt weight changes.  HEENT: Denies eye pain, eye redness, ear pain, ringing in the ears, wax buildup, runny nose, nasal congestion, bloody nose, or sore throat. Respiratory: Denies difficulty breathing, shortness of breath, cough or sputum production.   Cardiovascular: Denies chest pain, chest tightness, palpitations or swelling in the hands or feet.  Gastrointestinal: Denies abdominal  pain, bloating, constipation, diarrhea or blood in the stool.  GU: Denies urgency, frequency, pain with urination, burning sensation, blood in urine, odor or discharge. Musculoskeletal: Denies decrease in range of motion, difficulty with gait, muscle pain or joint pain and swelling.  Skin: Denies redness, rashes, lesions or ulcercations.  Neurological: Denies dizziness, difficulty with memory, difficulty with speech or problems with balance and coordination.  Psych: Denies anxiety, depression, SI/HI.  No other specific complaints in a complete review of systems (except as listed in HPI above).  Objective:   Physical Exam  BP 128/70 (BP Location: Left  Arm, Patient Position: Sitting, Cuff Size: Normal)   Pulse 81   Temp (!) 96.8 F (36 C) (Temporal)   Ht 5' 3.5" (1.613 m)   Wt 192 lb (87.1 kg)   SpO2 98%   BMI 33.48 kg/m   Wt Readings from Last 3 Encounters:  08/19/22 193 lb (87.5 kg)  02/12/22 193 lb 9.6 oz (87.8 kg)  08/02/21 194 lb 12.8 oz (88.4 kg)    General: Appears her stated age, obese, in NAD. Skin: Warm, dry and intact.  HEENT: Head: normal shape and size; Eyes: sclera white, no icterus, conjunctiva pink, PERRLA and EOMs intact;  Neck:  Neck supple, trachea midline. No masses, lumps or thyromegaly present.  Cardiovascular: Normal rate and rhythm. S1,S2 noted.  No murmur, rubs or gallops noted. No JVD or BLE edema. No carotid bruits noted. Pulmonary/Chest: Normal effort and positive vesicular breath sounds. No respiratory distress. No wheezes, rales or ronchi noted.  Abdomen: Normal bowel sounds.  Musculoskeletal: Strength 5/5 BUE/BLE. No difficulty with gait.  Neurological: Alert and oriented. Cranial nerves II-XII grossly intact. Coordination normal.  Psychiatric: Mood and affect normal. Behavior is normal. Judgment and thought content normal.     BMET    Component Value Date/Time   NA 141 09/19/2022 0854   K 4.2 09/19/2022 0854   CL 103 09/19/2022 0854   CO2 28 09/19/2022 0854   GLUCOSE 97 09/19/2022 0854   BUN 18 09/19/2022 0854   CREATININE 0.97 09/19/2022 0854   CALCIUM 9.6 09/19/2022 0854   GFRNONAA 61 07/24/2020 0837   GFRAA 71 07/24/2020 0837    Lipid Panel     Component Value Date/Time   CHOL 183 09/19/2022 0854   TRIG 141 09/19/2022 0854   HDL 45 (L) 09/19/2022 0854   CHOLHDL 4.1 09/19/2022 0854   LDLCALC 113 (H) 09/19/2022 0854    CBC    Component Value Date/Time   WBC 8.7 02/12/2022 1420   RBC 5.11 (H) 02/12/2022 1420   HGB 14.8 02/12/2022 1420   HCT 44.9 02/12/2022 1420   PLT 307 02/12/2022 1420   MCV 87.9 02/12/2022 1420   MCH 29.0 02/12/2022 1420   MCHC 33.0 02/12/2022 1420    RDW 13.3 02/12/2022 1420   LYMPHSABS 1,702 07/24/2020 0837   EOSABS 179 07/24/2020 0837   BASOSABS 38 07/24/2020 0837    Hgb A1C Lab Results  Component Value Date   HGBA1C 5.8 (H) 09/19/2022           Assessment & Plan:   Preventative Health Maintenance:  Encouraged her to get a flu shot in the fall She declines tetanus for financial reasons, advised if she gets bit or cut to go get this done Encouraged her to get her COVID-vaccine She declines pneumonia vaccination Discussed Shingrix vaccine, she will check coverage with her insurance company and schedule visit if she would like to have this done She no  longer needs to screen for cervical cancer Mammogram ordered- she will call to schedule She decline bone density She declines cologuard order at this time Encouraged her to consume a balanced diet and exercise regimen Advised her to see an eye doctor and dentist annually We will check CBC, c-Met, TSH, free T4, lipid, A1c today  RTC in 6 months, follow-up chronic conditions Webb Silversmith, NP

## 2023-03-19 NOTE — Patient Instructions (Signed)
Health Maintenance for Postmenopausal Women Menopause is a normal process in which your ability to get pregnant comes to an end. This process happens slowly over many months or years, usually between the ages of 48 and 55. Menopause is complete when you have missed your menstrual period for 12 months. It is important to talk with your health care provider about some of the most common conditions that affect women after menopause (postmenopausal women). These include heart disease, cancer, and bone loss (osteoporosis). Adopting a healthy lifestyle and getting preventive care can help to promote your health and wellness. The actions you take can also lower your chances of developing some of these common conditions. What are the signs and symptoms of menopause? During menopause, you may have the following symptoms: Hot flashes. These can be moderate or severe. Night sweats. Decrease in sex drive. Mood swings. Headaches. Tiredness (fatigue). Irritability. Memory problems. Problems falling asleep or staying asleep. Talk with your health care provider about treatment options for your symptoms. Do I need hormone replacement therapy? Hormone replacement therapy is effective in treating symptoms that are caused by menopause, such as hot flashes and night sweats. Hormone replacement carries certain risks, especially as you become older. If you are thinking about using estrogen or estrogen with progestin, discuss the benefits and risks with your health care provider. How can I reduce my risk for heart disease and stroke? The risk of heart disease, heart attack, and stroke increases as you age. One of the causes may be a change in the body's hormones during menopause. This can affect how your body uses dietary fats, triglycerides, and cholesterol. Heart attack and stroke are medical emergencies. There are many things that you can do to help prevent heart disease and stroke. Watch your blood pressure High  blood pressure causes heart disease and increases the risk of stroke. This is more likely to develop in people who have high blood pressure readings or are overweight. Have your blood pressure checked: Every 3-5 years if you are 18-39 years of age. Every year if you are 40 years old or older. Eat a healthy diet  Eat a diet that includes plenty of vegetables, fruits, low-fat dairy products, and lean protein. Do not eat a lot of foods that are high in solid fats, added sugars, or sodium. Get regular exercise Get regular exercise. This is one of the most important things you can do for your health. Most adults should: Try to exercise for at least 150 minutes each week. The exercise should increase your heart rate and make you sweat (moderate-intensity exercise). Try to do strengthening exercises at least twice each week. Do these in addition to the moderate-intensity exercise. Spend less time sitting. Even light physical activity can be beneficial. Other tips Work with your health care provider to achieve or maintain a healthy weight. Do not use any products that contain nicotine or tobacco. These products include cigarettes, chewing tobacco, and vaping devices, such as e-cigarettes. If you need help quitting, ask your health care provider. Know your numbers. Ask your health care provider to check your cholesterol and your blood sugar (glucose). Continue to have your blood tested as directed by your health care provider. Do I need screening for cancer? Depending on your health history and family history, you may need to have cancer screenings at different stages of your life. This may include screening for: Breast cancer. Cervical cancer. Lung cancer. Colorectal cancer. What is my risk for osteoporosis? After menopause, you may be   at increased risk for osteoporosis. Osteoporosis is a condition in which bone destruction happens more quickly than new bone creation. To help prevent osteoporosis or  the bone fractures that can happen because of osteoporosis, you may take the following actions: If you are 19-50 years old, get at least 1,000 mg of calcium and at least 600 international units (IU) of vitamin D per day. If you are older than age 50 but younger than age 70, get at least 1,200 mg of calcium and at least 600 international units (IU) of vitamin D per day. If you are older than age 70, get at least 1,200 mg of calcium and at least 800 international units (IU) of vitamin D per day. Smoking and drinking excessive alcohol increase the risk of osteoporosis. Eat foods that are rich in calcium and vitamin D, and do weight-bearing exercises several times each week as directed by your health care provider. How does menopause affect my mental health? Depression may occur at any age, but it is more common as you become older. Common symptoms of depression include: Feeling depressed. Changes in sleep patterns. Changes in appetite or eating patterns. Feeling an overall lack of motivation or enjoyment of activities that you previously enjoyed. Frequent crying spells. Talk with your health care provider if you think that you are experiencing any of these symptoms. General instructions See your health care provider for regular wellness exams and vaccines. This may include: Scheduling regular health, dental, and eye exams. Getting and maintaining your vaccines. These include: Influenza vaccine. Get this vaccine each year before the flu season begins. Pneumonia vaccine. Shingles vaccine. Tetanus, diphtheria, and pertussis (Tdap) booster vaccine. Your health care provider may also recommend other immunizations. Tell your health care provider if you have ever been abused or do not feel safe at home. Summary Menopause is a normal process in which your ability to get pregnant comes to an end. This condition causes hot flashes, night sweats, decreased interest in sex, mood swings, headaches, or lack  of sleep. Treatment for this condition may include hormone replacement therapy. Take actions to keep yourself healthy, including exercising regularly, eating a healthy diet, watching your weight, and checking your blood pressure and blood sugar levels. Get screened for cancer and depression. Make sure that you are up to date with all your vaccines. This information is not intended to replace advice given to you by your health care provider. Make sure you discuss any questions you have with your health care provider. Document Revised: 05/07/2021 Document Reviewed: 05/07/2021 Elsevier Patient Education  2023 Elsevier Inc.  

## 2023-03-20 LAB — HEMOGLOBIN A1C
Hgb A1c MFr Bld: 6.1 % of total Hgb — ABNORMAL HIGH (ref <5.7)
Mean Plasma Glucose: 128 mg/dL
eAG (mmol/L): 7.1 mmol/L

## 2023-03-20 LAB — COMPLETE METABOLIC PANEL WITH GFR
AG Ratio: 1.5 (calc) (ref 1.0–2.5)
ALT: 44 U/L — ABNORMAL HIGH (ref 6–29)
AST: 36 U/L — ABNORMAL HIGH (ref 10–35)
Albumin: 4.5 g/dL (ref 3.6–5.1)
Alkaline phosphatase (APISO): 71 U/L (ref 37–153)
BUN/Creatinine Ratio: 20 (calc) (ref 6–22)
BUN: 22 mg/dL (ref 7–25)
CO2: 25 mmol/L (ref 20–32)
Calcium: 9.7 mg/dL (ref 8.6–10.4)
Chloride: 105 mmol/L (ref 98–110)
Creat: 1.08 mg/dL — ABNORMAL HIGH (ref 0.60–1.00)
Globulin: 3 g/dL (calc) (ref 1.9–3.7)
Glucose, Bld: 85 mg/dL (ref 65–139)
Potassium: 4.6 mmol/L (ref 3.5–5.3)
Sodium: 140 mmol/L (ref 135–146)
Total Bilirubin: 0.5 mg/dL (ref 0.2–1.2)
Total Protein: 7.5 g/dL (ref 6.1–8.1)
eGFR: 55 mL/min/{1.73_m2} — ABNORMAL LOW (ref >=60)

## 2023-03-20 LAB — CBC
HCT: 43.1 % (ref 35.0–45.0)
Hemoglobin: 14.2 g/dL (ref 11.7–15.5)
MCH: 28.6 pg (ref 27.0–33.0)
MCHC: 32.9 g/dL (ref 32.0–36.0)
MCV: 86.9 fL (ref 80.0–100.0)
MPV: 9.7 fL (ref 7.5–12.5)
Platelets: 325 10*3/uL (ref 140–400)
RBC: 4.96 10*6/uL (ref 3.80–5.10)
RDW: 13 % (ref 11.0–15.0)
WBC: 6.8 10*3/uL (ref 3.8–10.8)

## 2023-03-20 LAB — TSH: TSH: 3.46 mIU/L (ref 0.40–4.50)

## 2023-03-20 LAB — LIPID PANEL
Cholesterol: 188 mg/dL (ref <200)
HDL: 47 mg/dL — ABNORMAL LOW (ref >=50)
LDL Cholesterol (Calc): 115 mg/dL (calc) — ABNORMAL HIGH
Non-HDL Cholesterol (Calc): 141 mg/dL (calc) — ABNORMAL HIGH (ref <130)
Total CHOL/HDL Ratio: 4 (calc) (ref <5.0)
Triglycerides: 151 mg/dL — ABNORMAL HIGH (ref <150)

## 2023-03-20 LAB — T4, FREE: Free T4: 0.9 ng/dL (ref 0.8–1.8)

## 2023-03-20 NOTE — Telephone Encounter (Signed)
Requested Prescriptions  Pending Prescriptions Disp Refills   lisinopril (ZESTRIL) 5 MG tablet [Pharmacy Med Name: LISINOPRIL 5 MG TAB] 90 tablet 0    Sig: TAKE 1 TABLET BY MOUTH ONCE DAILY     Cardiovascular:  ACE Inhibitors Failed - 03/19/2023  1:25 PM      Failed - Cr in normal range and within 180 days    Creat  Date Value Ref Range Status  03/19/2023 1.08 (H) 0.60 - 1.00 mg/dL Final         Failed - K in normal range and within 180 days    Potassium  Date Value Ref Range Status  03/19/2023 4.6 3.5 - 5.3 mmol/L Final         Passed - Patient is not pregnant      Passed - Last BP in normal range    BP Readings from Last 1 Encounters:  03/19/23 128/70         Passed - Valid encounter within last 6 months    Recent Outpatient Visits           Yesterday Encounter for general adult medical examination with abnormal findings   [facility] [place], [place] W, NP   7 months ago Hypothyroidism due to acquired atrophy of thyroid   [place], Coralie Keens, NP   1 year ago Encounter for general adult medical examination with abnormal findings   [facility] [place], Coralie Keens, NP   1 year ago Gastroesophageal reflux disease without esophagitis   [facility] [place], Coralie Keens, NP   2 years ago Screening for colon cancer   [facility] [place], Lupita Raider, FNP       Future Appointments             In 6 months [place], Coralie Keens, NP [facility], [facility]

## 2023-03-21 ENCOUNTER — Encounter: Payer: Self-pay | Admitting: Internal Medicine

## 2023-04-09 ENCOUNTER — Other Ambulatory Visit: Payer: Self-pay | Admitting: Internal Medicine

## 2023-04-10 NOTE — Telephone Encounter (Signed)
Requested Prescriptions  Pending Prescriptions Disp Refills   amLODipine (NORVASC) 10 MG tablet [Pharmacy Med Name: AMLODIPINE BESYLATE 10 MG TAB] 90 tablet 1    Sig: TAKE 1 TABLET BY MOUTH ONCE DAILY     Cardiovascular: Calcium Channel Blockers 2 Passed - 04/09/2023 12:07 PM      Passed - Last BP in normal range    BP Readings from Last 1 Encounters:  03/19/23 128/70         Passed - Last Heart Rate in normal range    Pulse Readings from Last 1 Encounters:  03/19/23 81         Passed - Valid encounter within last 6 months    Recent Outpatient Visits           3 weeks ago Encounter for general adult medical examination with abnormal findings   [place] [facility] [place], [place] W, NP   7 months ago Hypothyroidism due to acquired atrophy of thyroid   [place] [facility] [place], [name], NP   1 year ago Encounter for general adult medical examination with abnormal findings   [place] [facility] [place], [name], NP   1 year ago Gastroesophageal reflux disease without esophagitis   [facility] [facility] [place], [name], NP   2 years ago Screening for colon cancer   [place] [facility], [name], FNP       Future Appointments             In 5 months Baity, [name], NP  [facility]

## 2023-05-14 ENCOUNTER — Ambulatory Visit
Admission: RE | Admit: 2023-05-14 | Discharge: 2023-05-14 | Disposition: A | Discharge status: Home / Self Care | Payer: BC Managed Care – PPO | Source: Ambulatory Visit | Attending: Internal Medicine | Admitting: Internal Medicine

## 2023-05-14 DIAGNOSIS — Z1231 Encounter for screening mammogram for malignant neoplasm of breast: Secondary | ICD-10-CM | POA: Diagnosis present

## 2023-07-10 ENCOUNTER — Other Ambulatory Visit: Payer: Self-pay | Admitting: Internal Medicine

## 2023-07-11 NOTE — Telephone Encounter (Signed)
Unable to refill per protocol, Rx request is too soon. Last refill 04/10/23 for 90 and 1 refill.  Requested Prescriptions  Pending Prescriptions Disp Refills   amLODipine (NORVASC) 10 MG tablet [Pharmacy Med Name: AMLODIPINE BESYLATE 10 MG TAB] 90 tablet 1    Sig: TAKE 1 TABLET BY MOUTH ONCE DAILY     Cardiovascular: Calcium Channel Blockers 2 Passed - 07/10/2023  2:55 PM      Passed - Last BP in normal range    BP Readings from Last 1 Encounters:  03/19/23 128/70         Passed - Last Heart Rate in normal range    Pulse Readings from Last 1 Encounters:  03/19/23 81         Passed - Valid encounter within last 6 months    Recent Outpatient Visits           3 months ago Encounter for general adult medical examination with abnormal findings   [facility] [place], [place] W, NP   10 months ago Hypothyroidism due to acquired atrophy of thyroid   [place] [facility] [place], [name], NP   1 year ago Encounter for general adult medical examination with abnormal findings   [place] [facility] [place], [name], NP   1 year ago Gastroesophageal reflux disease without esophagitis   [facility] [place], [name], NP   2 years ago Screening for colon cancer   [place] [facility], [name], FNP       Future Appointments             In 2 months Baity, [name], NP [facility], [place] [facility]

## 2023-09-23 ENCOUNTER — Ambulatory Visit: Payer: BC Managed Care – PPO | Admitting: Internal Medicine

## 2023-09-23 VITALS — BP 132/84 | HR 96 | Temp 96.6°F | Wt 187.0 lb

## 2023-09-23 DIAGNOSIS — K219 Gastro-esophageal reflux disease without esophagitis: Secondary | ICD-10-CM

## 2023-09-23 DIAGNOSIS — E034 Atrophy of thyroid (acquired): Secondary | ICD-10-CM

## 2023-09-23 DIAGNOSIS — Z1211 Encounter for screening for malignant neoplasm of colon: Secondary | ICD-10-CM | POA: Diagnosis not present

## 2023-09-23 DIAGNOSIS — E6609 Other obesity due to excess calories: Secondary | ICD-10-CM

## 2023-09-23 DIAGNOSIS — E66811 Other obesity due to excess calories: Secondary | ICD-10-CM

## 2023-09-23 DIAGNOSIS — I10 Essential (primary) hypertension: Secondary | ICD-10-CM

## 2023-09-23 DIAGNOSIS — R7303 Prediabetes: Secondary | ICD-10-CM | POA: Diagnosis not present

## 2023-09-23 DIAGNOSIS — E781 Pure hyperglyceridemia: Secondary | ICD-10-CM | POA: Diagnosis not present

## 2023-09-23 DIAGNOSIS — Z6832 Body mass index (BMI) 32.0-32.9, adult: Secondary | ICD-10-CM

## 2023-09-23 MED ORDER — AMLODIPINE-OLMESARTAN 10-20 MG PO TABS: 1.0000 | ORAL_TABLET | Freq: Every day | ORAL | 0 refills | Status: DC

## 2023-09-23 NOTE — Progress Notes (Signed)
Subjective:    Patient ID: Natalie Kidd, female    DOB: Dec 12, 1951, 72 y.o.   MRN: [id number]  HPI  Patient presents to clinic today for 13-month follow-up of chronic conditions.  HTN: Her BP today is 132/84.  She is taking amlodipine and lisinopril as prescribed. Pt reports she has a cough and is wondering if it could be lisinopril.  ECG from 11/2018 reviewed.  GERD: Triggered by eating late at night.  She has occasional breakthrough on omeprazole for which she takes tums with good relief.  There is no upper GI on file.  Hypothyroidism: She is no longer taking levothyroxine.  She does not follow with endocrinology.  HLD: Her last LDL was 115, triglycerides 151, 02/2023.  She is not taking any cholesterol-lowering medication at this time.  She tries to consume a low-fat diet.  Prediabetes: Her last A1c was 6.1%, 02/2023.  She is not taking any oral diabetic medication at this time.  She does not check her sugars.  Review of Systems  No past medical history on file.  Current Outpatient Medications  Medication Sig Dispense Refill   Albuterol Sulfate (PROAIR RESPICLICK) 108 (90 Base) MCG/ACT AEPB Inhale 1-2 puffs into the lungs every 6 (six) hours as needed (Wheezing and shortness of breath). 1 each 2   amLODipine (NORVASC) 10 MG tablet TAKE 1 TABLET BY MOUTH ONCE DAILY 90 tablet 1   Ascorbic Acid (VITAMIN C) 100 MG tablet Take 100 mg by mouth daily.     fluticasone (FLONASE) 50 MCG/ACT nasal spray Place 2 sprays into both nostrils daily. 16 g 6   levothyroxine (SYNTHROID) 50 MCG tablet TAKE 1 TABLET BY MOUTH ONCE DAILY ON Kidd EMPTY STOMACH. WAIT 30 MINUTES BEFORE TAKING OTHER MEDS. 90 tablet 0   lisinopril (ZESTRIL) 5 MG tablet TAKE 1 TABLET BY MOUTH ONCE DAILY 90 tablet 1   Multiple Vitamin (MULTIVITAMIN) tablet Take 1 tablet by mouth 2 (two) times daily.     omeprazole (PRILOSEC) 20 MG capsule TAKE 1 CAPSULE(20 MG) BY MOUTH TWICE DAILY BEFORE A MEAL 180 capsule 3   TRIAMCINOLONE PO  Take by mouth.     No current facility-administered medications for this visit.    Allergies  Allergen Reactions   Bee Venom Swelling   Flagyl [Metronidazole] Other (See Comments)    Loss of motor skills   Penicillins    Tape Hives    Family History  Problem Relation Age of Onset   Heart disease Father    Kidney disease Father    Breast cancer Neg Hx     Social History   Socioeconomic History   Marital status: Married    Spouse name: Not on file   Number of children: Not on file   Years of education: Not on file   Highest education level: Not on file  Occupational History   Not on file  Tobacco Use   Smoking status: Never   Smokeless tobacco: Never  Vaping Use   Vaping status: Never Used  Substance and Sexual Activity   Alcohol use: No   Drug use: No   Sexual activity: Not on file  Other Topics Concern   Not on file  Social History Narrative   Not on file   Social Determinants of Health   Financial Resource Strain: Not on file  Food Insecurity: Not on file  Transportation Needs: Not on file  Physical Activity: Not on file  Stress: Not on file  Social Connections: Not on  file  Intimate Partner Violence: Not on file     Constitutional: Denies fever, malaise, fatigue, headache or abrupt weight changes.  HEENT: Denies eye pain, eye redness, ear pain, ringing in the ears, wax buildup, runny nose, nasal congestion, bloody nose, or sore throat. Respiratory: Patient reports chronic cough.  Denies difficulty breathing, shortness of breath, or sputum production.   Cardiovascular: Denies chest pain, chest tightness, palpitations or swelling in the hands or feet.  Gastrointestinal: Denies abdominal pain, bloating, constipation, diarrhea or blood in the stool.  GU: Denies urgency, frequency, pain with urination, burning sensation, blood in urine, odor or discharge. Musculoskeletal: Denies decrease in range of motion, difficulty with gait, muscle pain or joint pain  and swelling.  Skin: Denies redness, rashes, lesions or ulcercations.  Neurological: Denies dizziness, difficulty with memory, difficulty with speech or problems with balance and coordination.  Psych: Denies anxiety, depression, SI/HI.  No other specific complaints in a complete review of systems (except as listed in HPI above).     Objective:   Physical Exam BP 132/84 (BP Location: Left Arm, Patient Position: Sitting, Cuff Size: Normal)   Pulse 96   Temp (!) 96.6 F (35.9 C) (Temporal)   Wt 187 lb (84.8 kg)   SpO2 97%   BMI 32.61 kg/m   Wt Readings from Last 3 Encounters:  03/19/23 192 lb (87.1 kg)  08/19/22 193 lb (87.5 kg)  02/12/22 193 lb 9.6 oz (87.8 kg)    General: Appears her stated age, obese, in NAD. Skin: Warm, dry and intact.  HEENT: Head: normal shape and size; Eyes: sclera white, no icterus, conjunctiva pink, PERRLA and EOMs intact;  Neck:  Neck supple, trachea midline. No masses, lumps or thyromegaly present.  Cardiovascular: Normal rate and rhythm. S1,S2 noted.  No murmur, rubs or gallops noted. No JVD or BLE edema. No carotid bruits noted. Pulmonary/Chest: Normal effort and positive vesicular breath sounds. No respiratory distress. No wheezes, rales or ronchi noted.  Abdomen: Soft and nontender. Normal bowel sounds.  Musculoskeletal:  No difficulty with gait.  Neurological: Alert and oriented. Coordination normal.  Psychiatric: Mood and affect normal. Behavior is normal. Judgment and thought content normal.   BMET    Component Value Date/Time   NA 140 03/19/2023 1035   K 4.6 03/19/2023 1035   CL 105 03/19/2023 1035   CO2 25 03/19/2023 1035   GLUCOSE 85 03/19/2023 1035   BUN 22 03/19/2023 1035   CREATININE 1.08 (H) 03/19/2023 1035   CALCIUM 9.7 03/19/2023 1035   GFRNONAA 61 07/24/2020 0837   GFRAA 71 07/24/2020 0837    Lipid Panel     Component Value Date/Time   CHOL 188 03/19/2023 1035   TRIG 151 (H) 03/19/2023 1035   HDL 47 (L) 03/19/2023 1035    CHOLHDL 4.0 03/19/2023 1035   LDLCALC 115 (H) 03/19/2023 1035    CBC    Component Value Date/Time   WBC 6.8 03/19/2023 1035   RBC 4.96 03/19/2023 1035   HGB 14.2 03/19/2023 1035   HCT 43.1 03/19/2023 1035   PLT 325 03/19/2023 1035   MCV 86.9 03/19/2023 1035   MCH 28.6 03/19/2023 1035   MCHC 32.9 03/19/2023 1035   RDW 13.0 03/19/2023 1035   LYMPHSABS 1,702 07/24/2020 0837   EOSABS 179 07/24/2020 0837   BASOSABS 38 07/24/2020 0837    Hgb A1C Lab Results  Component Value Date   HGBA1C 6.1 (H) 03/19/2023            Assessment &  Plan:     RTC in 6 months for your annual exam Nicki Reaper, NP

## 2023-09-23 NOTE — Assessment & Plan Note (Signed)
TSH and free T4 today Not currently taking levothyroxine

## 2023-09-23 NOTE — Assessment & Plan Note (Signed)
Avoid eating and laying down Encourage weight loss as this can help reduce reflux symptoms Okay to continue omeprazole and Tums

## 2023-09-23 NOTE — Patient Instructions (Signed)

## 2023-09-23 NOTE — Assessment & Plan Note (Signed)
Encouraged diet and exercise for weight loss ?

## 2023-09-23 NOTE — Assessment & Plan Note (Signed)
C-Met and lipid profile today Encouraged her to consume a low-fat diet 

## 2023-09-23 NOTE — Assessment & Plan Note (Signed)
A1c today Encourage low-carb diet and exercise for weight loss

## 2023-09-23 NOTE — Assessment & Plan Note (Signed)
Controlled but having cough with lisinopril, will DC amlodipine and lisinopril Rx for amlodipine-olmesartan 10-20 mg daily Reinforced DASH diet and exercise for weight loss C-Met today

## 2023-09-24 LAB — COMPLETE METABOLIC PANEL WITH GFR
AG Ratio: 1.9 (calc) (ref 1.0–2.5)
ALT: 61 U/L — ABNORMAL HIGH (ref 6–29)
AST: 51 U/L — ABNORMAL HIGH (ref 10–35)
Albumin: 4.7 g/dL (ref 3.6–5.1)
Alkaline phosphatase (APISO): 72 U/L (ref 37–153)
BUN/Creatinine Ratio: 18 (calc) (ref 6–22)
BUN: 19 mg/dL (ref 7–25)
CO2: 27 mmol/L (ref 20–32)
Calcium: 10 mg/dL (ref 8.6–10.4)
Chloride: 103 mmol/L (ref 98–110)
Creat: 1.08 mg/dL — ABNORMAL HIGH (ref 0.60–1.00)
Globulin: 2.5 g/dL (calc) (ref 1.9–3.7)
Glucose, Bld: 87 mg/dL (ref 65–99)
Potassium: 4.4 mmol/L (ref 3.5–5.3)
Sodium: 138 mmol/L (ref 135–146)
Total Bilirubin: 0.5 mg/dL (ref 0.2–1.2)
Total Protein: 7.2 g/dL (ref 6.1–8.1)
eGFR: 55 mL/min/{1.73_m2} — ABNORMAL LOW (ref >=60)

## 2023-09-24 LAB — CBC
HCT: 44.2 % (ref 35.0–45.0)
Hemoglobin: 14.3 g/dL (ref 11.7–15.5)
MCH: 28.8 pg (ref 27.0–33.0)
MCHC: 32.4 g/dL (ref 32.0–36.0)
MCV: 88.9 fL (ref 80.0–100.0)
MPV: 9.8 fL (ref 7.5–12.5)
Platelets: 320 10*3/uL (ref 140–400)
RBC: 4.97 10*6/uL (ref 3.80–5.10)
RDW: 13.1 % (ref 11.0–15.0)
WBC: 7.4 10*3/uL (ref 3.8–10.8)

## 2023-09-24 LAB — HEMOGLOBIN A1C
Hgb A1c MFr Bld: 6.1 % of total Hgb — ABNORMAL HIGH (ref <5.7)
Mean Plasma Glucose: 128 mg/dL
eAG (mmol/L): 7.1 mmol/L

## 2023-09-24 LAB — TSH: TSH: 3.19 mIU/L (ref 0.40–4.50)

## 2023-09-24 LAB — LIPID PANEL
Cholesterol: 189 mg/dL (ref <200)
HDL: 48 mg/dL — ABNORMAL LOW (ref >=50)
LDL Cholesterol (Calc): 113 mg/dL (calc) — ABNORMAL HIGH
Non-HDL Cholesterol (Calc): 141 mg/dL (calc) — ABNORMAL HIGH (ref <130)
Total CHOL/HDL Ratio: 3.9 (calc) (ref <5.0)
Triglycerides: 165 mg/dL — ABNORMAL HIGH (ref <150)

## 2023-09-24 LAB — T4, FREE: Free T4: 1 ng/dL (ref 0.8–1.8)

## 2023-10-28 LAB — COLOGUARD: COLOGUARD: NEGATIVE

## 2023-11-22 ENCOUNTER — Other Ambulatory Visit: Payer: Self-pay | Admitting: Internal Medicine

## 2023-11-22 DIAGNOSIS — E034 Atrophy of thyroid (acquired): Secondary | ICD-10-CM

## 2023-11-24 NOTE — Telephone Encounter (Signed)
Requested Prescriptions  Pending Prescriptions Disp Refills   albuterol (VENTOLIN HFA) 108 (90 Base) MCG/ACT inhaler [Pharmacy Med Name: ALBUTEROL SULFATE HFA 108 (90 BASE)] 18 g 0    Sig: INHALE 1-2 PUFFS INTO THE LUNGS EVERY 6 HOURS AS NEEDED FOR WHEEZING OR SHORTNESS OF BREATH     Pulmonology:  Beta Agonists 2 Passed - 11/22/2023  9:09 AM      Passed - Last BP in normal range    BP Readings from Last 1 Encounters:  09/23/23 132/84         Passed - Last Heart Rate in normal range    Pulse Readings from Last 1 Encounters:  09/23/23 96         Passed - Valid encounter within last 12 months    Recent Outpatient Visits           2 months ago Hypothyroidism due to acquired atrophy of thyroid   [place] [facility] [place], [name], NP   8 months ago Encounter for general adult medical examination with abnormal findings   [place] [facility] [place], [name], NP   1 year ago Hypothyroidism due to acquired atrophy of thyroid   [place] [facility] [place], [name], NP   1 year ago Encounter for general adult medical examination with abnormal findings   [place] [facility] [place], [name], NP   2 years ago Gastroesophageal reflux disease without esophagitis   [place] [facility] [place], [name], NP       Future Appointments             In 4 months Althea Charon, [name], DO  [facility], [facility]

## 2023-12-15 ENCOUNTER — Other Ambulatory Visit: Payer: Self-pay

## 2023-12-15 MED ORDER — AMLODIPINE-OLMESARTAN 10-20 MG PO TABS: 1.0000 | ORAL_TABLET | Freq: Every day | ORAL | 1 refills | Status: DC

## 2024-03-23 ENCOUNTER — Encounter: Payer: BC Managed Care – PPO | Admitting: Internal Medicine

## 2024-03-26 ENCOUNTER — Encounter: Payer: Self-pay | Admitting: Family Medicine

## 2024-03-26 ENCOUNTER — Ambulatory Visit (INDEPENDENT_AMBULATORY_CARE_PROVIDER_SITE_OTHER): Payer: Self-pay | Admitting: Family Medicine

## 2024-03-26 VITALS — BP 152/74 | HR 82 | Ht 63.5 in | Wt 197.0 lb

## 2024-03-26 DIAGNOSIS — Z0000 Encounter for general adult medical examination without abnormal findings: Secondary | ICD-10-CM | POA: Diagnosis not present

## 2024-03-26 DIAGNOSIS — E034 Atrophy of thyroid (acquired): Secondary | ICD-10-CM

## 2024-03-26 DIAGNOSIS — E782 Mixed hyperlipidemia: Secondary | ICD-10-CM | POA: Diagnosis not present

## 2024-03-26 DIAGNOSIS — L209 Atopic dermatitis, unspecified: Secondary | ICD-10-CM

## 2024-03-26 DIAGNOSIS — K219 Gastro-esophageal reflux disease without esophagitis: Secondary | ICD-10-CM

## 2024-03-26 DIAGNOSIS — Z1231 Encounter for screening mammogram for malignant neoplasm of breast: Secondary | ICD-10-CM

## 2024-03-26 DIAGNOSIS — I10 Essential (primary) hypertension: Secondary | ICD-10-CM

## 2024-03-26 DIAGNOSIS — R7303 Prediabetes: Secondary | ICD-10-CM

## 2024-03-26 MED ORDER — AMLODIPINE-OLMESARTAN 10-20 MG PO TABS: 1.0000 | ORAL_TABLET | Freq: Every day | ORAL | 3 refills | Status: AC

## 2024-03-26 MED ORDER — TRIAMCINOLONE ACETONIDE 0.5 % EX OINT: 1.0000 | TOPICAL_OINTMENT | Freq: Two times a day (BID) | CUTANEOUS | 2 refills | Status: AC

## 2024-03-26 MED ORDER — OMEPRAZOLE 20 MG PO CPDR: 20.0000 mg | DELAYED_RELEASE_CAPSULE | Freq: Two times a day (BID) | ORAL | 3 refills | Status: AC

## 2024-03-26 NOTE — Progress Notes (Signed)
 Subjective:    Patient ID: Natalie Kidd, female    DOB: Nov 01, 1951, 73 y.o.   MRN: [id number]  Natalie Kidd is a 73 y.o. female presenting on 03/26/2024 for Annual Exam   HPI  Discussed the use of AI scribe software for clinical note transcription with the patient, who gave verbal consent to proceed.  History of Present Illness   Natalie Kidd is a 73 year old female who presents for Kidd annual checkup and follow-up on her blood work. She is accompanied by her son-in-law, Roland.  She is focused on managing her prediabetes and reviewing her blood work results. Her A1c levels have been in the prediabetic range for the past two years, with values of 5.8 and 6.1, showing a gradual trend. She prefers lifestyle modifications over medication for managing her health. She questions the necessity of biannual blood work and is not keen on frequent doctor visits or medication unless absolutely necessary.  She has a history of elevated cholesterol and mildly elevated liver enzymes for the past three to four years. Despite exercising almost daily for at least half Kidd hour and being very conscious of her diet, she finds it frustrating that her weight remains a concern. She prefers to manage her cholesterol through diet and lifestyle rather than medication.  She experiences dry skin patches resembling psoriasis, particularly on her back, and has tried various treatments including tea tree oil and Neosporin without success. She recalls using a Kenalog ointment in the past, which was effective.  She experiences dry mouth at night since starting her blood pressure medication, which she manages by keeping water by her bed. No swelling in feet or ankles. She has a family history of heart issues, with her first cousin having had a major heart attack after stopping blood pressure medication. This has influenced her to be diligent with her own blood pressure medication, although she missed her dose this morning  due to a busy schedule. She monitors her blood pressure at home regularly.      Health Maintenance:  Last Cologuard 09/2023, negative, repeat 3 years.  Mammogram last 04/2023, next due Mammogram.       03/26/2024    9:48 AM 09/23/2023    9:07 AM 03/19/2023   10:36 AM  Depression screen PHQ 2/9  Decreased Interest 0 0 0  Down, Depressed, Hopeless 0 0 0  PHQ - 2 Score 0 0 0  Altered sleeping 0    Tired, decreased energy 0    Change in appetite 0    Feeling bad or failure about yourself  0    Trouble concentrating 0    Moving slowly or fidgety/restless 0    Suicidal thoughts 0    PHQ-9 Score 0    Difficult doing work/chores Not difficult at all         03/26/2024    9:48 AM 09/23/2023    9:07 AM 03/19/2023   10:36 AM 02/12/2022    1:51 PM  GAD 7 : Generalized Anxiety Score  Nervous, Anxious, on Edge 0 0 0 0  Control/stop worrying 0 0 0 0  Worry too much - different things 0 0 0 0  Trouble relaxing 0 0 0 0  Restless 0 0 0 0  Easily annoyed or irritable 0 0 0 0  Afraid - awful might happen 0 0 0 0  Total GAD 7 Score 0 0 0 0  Anxiety Difficulty Not difficult at all Not difficult at all  Not difficult at all Not difficult at all     History reviewed. No pertinent past medical history. Past Surgical History:  Procedure Laterality Date   APPENDECTOMY     ELBOW SURGERY Left    TUBAL LIGATION     then untide   Social History   Socioeconomic History   Marital status: Married    Spouse name: Not on file   Number of children: Not on file   Years of education: Not on file   Highest education level: GED or equivalent  Occupational History   Not on file  Tobacco Use   Smoking status: Never   Smokeless tobacco: Never  Vaping Use   Vaping status: Never Used  Substance and Sexual Activity   Alcohol use: No   Drug use: No   Sexual activity: Not on file  Other Topics Concern   Not on file  Social History Narrative   Not on file   Social Drivers of Health    Financial Resource Strain: Low Risk  (09/23/2023)   Overall Financial Resource Strain (CARDIA)    Difficulty of Paying Living Expenses: Not hard at all  Food Insecurity: No Food Insecurity (09/23/2023)   Hunger Vital Sign    Worried About Running Out of Food in the Last Year: Never true    Ran Out of Food in the Last Year: Never true  Transportation Needs: No Transportation Needs (09/23/2023)   PRAPARE - Administrator, Civil Service (Medical): No    Lack of Transportation (Non-Medical): No  Physical Activity: Sufficiently Active (09/23/2023)   Exercise Vital Sign    Days of Exercise per Week: 6 days    Minutes of Exercise per Session: 30 min  Stress: No Stress Concern Present (09/23/2023)   [employer] of Occupational Health - Occupational Stress Questionnaire    Feeling of Stress : Not at all  Social Connections: Moderately Integrated (09/23/2023)   Social Connection and Isolation Panel [NHANES]    Frequency of Communication with Friends and Family: More than three times a week    Frequency of Social Gatherings with Friends and Family: Three times a week    Attends Religious Services: More than 4 times per year    Active Member of Clubs or Organizations: No    Attends Engineer, structural: Not on file    Marital Status: Married  Catering manager Violence: Not on file   Family History  Problem Relation Age of Onset   Heart disease Father    Kidney disease Father    Breast cancer Neg Hx    Current Outpatient Medications on File Prior to Visit  Medication Sig   albuterol (VENTOLIN HFA) 108 (90 Base) MCG/ACT inhaler INHALE 1-2 PUFFS INTO THE LUNGS EVERY 6 HOURS AS NEEDED FOR WHEEZING OR SHORTNESS OF BREATH   Ascorbic Acid (VITAMIN C) 100 MG tablet Take 100 mg by mouth daily.   fluticasone (FLONASE) 50 MCG/ACT nasal spray Place 2 sprays into both nostrils daily.   Multiple Vitamin (MULTIVITAMIN) tablet Take 1 tablet by mouth 2 (two) times daily.   No  current facility-administered medications on file prior to visit.    Review of Systems  Constitutional:  Negative for activity change, appetite change, chills, diaphoresis, fatigue and fever.  HENT:  Negative for congestion and hearing loss.   Eyes:  Negative for visual disturbance.  Respiratory:  Negative for cough, chest tightness, shortness of breath and wheezing.   Cardiovascular:  Negative for chest pain, palpitations  and leg swelling.  Gastrointestinal:  Negative for abdominal pain, constipation, diarrhea, nausea and vomiting.  Genitourinary:  Negative for dysuria, frequency and hematuria.  Musculoskeletal:  Negative for arthralgias and neck pain.  Skin:  Negative for rash.  Neurological:  Negative for dizziness, weakness, light-headedness, numbness and headaches.  Hematological:  Negative for adenopathy.  Psychiatric/Behavioral:  Negative for behavioral problems, dysphoric mood and sleep disturbance.    Per HPI unless specifically indicated above     Objective:    BP (!) 152/74 (BP Location: Left Arm, Patient Position: Sitting)   Pulse 82   Ht 5' 3.5" (1.613 m)   Wt 197 lb (89.4 kg)   SpO2 96%   BMI 34.35 kg/m   Wt Readings from Last 3 Encounters:  03/26/24 197 lb (89.4 kg)  09/23/23 187 lb (84.8 kg)  03/19/23 192 lb (87.1 kg)    Physical Exam Vitals and nursing note reviewed.  Constitutional:      General: She is not in acute distress.    Appearance: She is well-developed. She is not diaphoretic.     Comments: Well-appearing, comfortable, cooperative  HENT:     Head: Normocephalic and atraumatic.  Eyes:     General:        Right eye: No discharge.        Left eye: No discharge.     Conjunctiva/sclera: Conjunctivae normal.     Pupils: Pupils are equal, round, and reactive to light.  Neck:     Thyroid: No thyromegaly.     Vascular: No carotid bruit.  Cardiovascular:     Rate and Rhythm: Normal rate and regular rhythm.     Pulses: Normal pulses.     Heart  sounds: Normal heart sounds. No murmur heard. Pulmonary:     Effort: Pulmonary effort is normal. No respiratory distress.     Breath sounds: Normal breath sounds. No wheezing or rales.  Abdominal:     General: Bowel sounds are normal. There is no distension.     Palpations: Abdomen is soft. There is no mass.     Tenderness: There is no abdominal tenderness.  Musculoskeletal:        General: No tenderness. Normal range of motion.     Cervical back: Normal range of motion and neck supple.     Right lower leg: No edema.     Left lower leg: No edema.     Comments: Upper / Lower Extremities: - Normal muscle tone, strength bilateral upper extremities 5/5, lower extremities 5/5  Lymphadenopathy:     Cervical: No cervical adenopathy.  Skin:    General: Skin is warm and dry.     Findings: No erythema or rash.  Neurological:     Mental Status: She is alert and oriented to person, place, and time.     Comments: Distal sensation intact to light touch all extremities  Psychiatric:        Mood and Affect: Mood normal.        Behavior: Behavior normal.        Thought Content: Thought content normal.     Comments: Well groomed, good eye contact, normal speech and thoughts     Results for orders placed or performed in visit on 09/23/23  CBC   Collection Time: 09/23/23  9:20 AM  Result Value Ref Range   WBC 7.4 3.8 - 10.8 Thousand/uL   RBC 4.97 3.80 - 5.10 Million/uL   Hemoglobin 14.3 11.7 - 15.5 g/dL   HCT 44.2  35.0 - 45.0 %   MCV 88.9 80.0 - 100.0 fL   MCH 28.8 27.0 - 33.0 pg   MCHC 32.4 32.0 - 36.0 g/dL   RDW 16.1 09.6 - 04.5 %   Platelets 320 140 - 400 Thousand/uL   MPV 9.8 7.5 - 12.5 fL  COMPLETE METABOLIC PANEL WITH GFR   Collection Time: 09/23/23  9:20 AM  Result Value Ref Range   Glucose, Bld 87 65 - 99 mg/dL   BUN 19 7 - 25 mg/dL   Creat 4.09 (H) 8.11 - 1.00 mg/dL   eGFR 55 (L) > OR = 60 mL/min/1.17m2   BUN/Creatinine Ratio 18 6 - 22 (calc)   Sodium 138 135 - 146 mmol/L    Potassium 4.4 3.5 - 5.3 mmol/L   Chloride 103 98 - 110 mmol/L   CO2 27 20 - 32 mmol/L   Calcium 10.0 8.6 - 10.4 mg/dL   Total Protein 7.2 6.1 - 8.1 g/dL   Albumin 4.7 3.6 - 5.1 g/dL   Globulin 2.5 1.9 - 3.7 g/dL (calc)   AG Ratio 1.9 1.0 - 2.5 (calc)   Total Bilirubin 0.5 0.2 - 1.2 mg/dL   Alkaline phosphatase (APISO) 72 37 - 153 U/L   AST 51 (H) 10 - 35 U/L   ALT 61 (H) 6 - 29 U/L  TSH   Collection Time: 09/23/23  9:20 AM  Result Value Ref Range   TSH 3.19 0.40 - 4.50 mIU/L  T4, free   Collection Time: 09/23/23  9:20 AM  Result Value Ref Range   Free T4 1.0 0.8 - 1.8 ng/dL  Lipid panel   Collection Time: 09/23/23  9:20 AM  Result Value Ref Range   Cholesterol 189 <200 mg/dL   HDL 48 (L) > OR = 50 mg/dL   Triglycerides 914 (H) <150 mg/dL   LDL Cholesterol (Calc) 113 (H) mg/dL (calc)   Total CHOL/HDL Ratio 3.9 <5.0 (calc)   Non-HDL Cholesterol (Calc) 141 (H) <130 mg/dL (calc)  Hemoglobin N8G   Collection Time: 09/23/23  9:20 AM  Result Value Ref Range   Hgb A1c MFr Bld 6.1 (H) <5.7 % of total Hgb   Mean Plasma Glucose 128 mg/dL   eAG (mmol/L) 7.1 mmol/L  Cologuard   Collection Time: 10/21/23  8:51 AM  Result Value Ref Range   COLOGUARD Negative Negative      Assessment & Plan:   Problem List Items Addressed This Visit     Essential hypertension   Relevant Medications   amlodipine-olmesartan (AZOR) 10-20 MG tablet   Other Relevant Orders   COMPLETE METABOLIC PANEL WITHOUT GFR   CBC with Differential/Platelet   GERD (gastroesophageal reflux disease)   Relevant Medications   omeprazole (PRILOSEC) 20 MG capsule   Hypothyroidism   Relevant Orders   TSH   T4, free   Mixed hyperlipidemia   Relevant Medications   amlodipine-olmesartan (AZOR) 10-20 MG tablet   Other Relevant Orders   TSH   Lipid panel   COMPLETE METABOLIC PANEL WITHOUT GFR   CT CARDIAC SCORING (SELF PAY ONLY)   Prediabetes   Relevant Orders   Hemoglobin A1c   Other Visit Diagnoses        Annual physical exam    -  Primary   Relevant Orders   TSH   Lipid panel   Hemoglobin A1c   COMPLETE METABOLIC PANEL WITHOUT GFR   CBC with Differential/Platelet   T4, free     Encounter for screening mammogram for  malignant neoplasm of breast       Relevant Orders   MM 3D SCREENING MAMMOGRAM BILATERAL BREAST     Atopic dermatitis, unspecified type       Relevant Medications   triamcinolone ointment (KENALOG) 0.5 %        Updated Health Maintenance information Fasting labs ordered Encouraged improvement to lifestyle with diet and exercise Goal of weight loss   Prediabetes A1c levels stable in prediabetic range for two years. Prefers minimal intervention. - Order blood draw for A1c levels. - Consider biannual finger prick A1c monitoring. - Discuss results and follow-up interval based on A1c stability.  Hyperlipidemia Slightly elevated cholesterol. CT scan ordered for coronary artery disease assessment. 21.3% ten-year risk of heart attack or stroke. Prefers lifestyle modifications. - Order blood draw for cholesterol levels. - Order CT scan for coronary artery disease. - Discuss potential statin therapy based on results.  Hypertension Compliant with antihypertensive medication. Missed dose may have caused elevated readings. Reports dry mouth. - Continue current antihypertensive regimen. - Monitor blood pressure at home. - Consider remedies for dry mouth, such as omega-3 supplements or biotin.  Eczema Recurrent dry skin patches. Previous Kenalog ointment effective. - Prescribe stronger Kenalog ointment. - Apply twice daily for 1-2 weeks, then as needed.  Gastroesophageal Reflux Disease (GERD) On long-term antacid therapy, limited to once daily due to insurance. Uses Tums for symptom control. - Continue current antacid regimen. - Prescribe antacid refills. - Use Tums as needed for breakthrough symptoms.  General Health Maintenance Up to date with screenings. Declines  pneumonia vaccine. - Order mammogram for May 2025. - Discuss bone density scan at next visit.  Review labs then schedule 6 month or 12 month  Orders Placed This Encounter  Procedures   CT CARDIAC SCORING (SELF PAY ONLY)    Standing Status:   Future    Expiration Date:   03/26/2025    Preferred imaging location?:   [facility]   MM 3D SCREENING MAMMOGRAM BILATERAL BREAST    Standing Status:   Future    Expected Date:   05/13/2024    Expiration Date:   03/26/2025    Reason for Exam (SYMPTOM  OR DIAGNOSIS REQUIRED):   Screening bilateral 3D Mammogram Tomo    Preferred imaging location?:   [facility]   TSH   Lipid panel    Has the patient fasted?:   Yes   Hemoglobin A1c   COMPLETE METABOLIC PANEL WITHOUT GFR   CBC with Differential/Platelet   T4, free     Meds ordered this encounter  Medications   amlodipine-olmesartan (AZOR) 10-20 MG tablet    Sig: Take 1 tablet by mouth daily.    Dispense:  90 tablet    Refill:  3    Add future refills   omeprazole (PRILOSEC) 20 MG capsule    Sig: Take 1 capsule (20 mg total) by mouth 2 (two) times daily before a meal.    Dispense:  180 capsule    Refill:  3    Add future refills   triamcinolone ointment (KENALOG) 0.5 %    Sig: Apply 1 Application topically 2 (two) times daily.    Dispense:  30 g    Refill:  2     Follow up plan: Return if symptoms worsen or fail to improve.  Saralyn Pilar, DO [facility] 03/26/2024, 10:07 AM

## 2024-03-26 NOTE — Patient Instructions (Addendum)
 Thank you for coming to the office today.  Labs today, stay tuned for results  If A1c is still mid to higher 6 range, we can consider repeat in 6 months, if improved or stable we can follow up in 1 year.  Already discussed cholesterol, pending result on the CT scan we may recommend a statin therapy.  For Mammogram screening for breast cancer   Call the Imaging Center below anytime to schedule your own appointment now that order has been placed.  [facility] at [facility] [address], [address] [place], [place] [address] Phone: [phone]  Triamcinolone ointment for eczema rash.  Fish Oil / Omega 3 version  Biotene  Dentist recommendation for dry mouth - Mixture of equal parts Children's Benadryl and Pepto Bismol (2.5 mL of each = 5mL total) Swish and spit and rinse. Do not swallow   You have been referred for a Coronary Calcium Score Cardiac CT Scan. This is a screening test for patients aged 82-50+ with cardiovascular risk factors or who are healthy but would be interested in Cardiovascular Screening for heart disease. Even if there is a family history of heart disease, this imaging can be useful. Typically it can be done every 5+ years or at a different timeline we agree on  The scan will look at the chest and mainly focus on the heart and identify early signs of calcium build up or blockages within the heart arteries. It is not 100% accurate for identifying blockages or heart disease, but it is useful to help [place] predict who may have some early changes or be at risk in the future for a heart attack or cardiovascular problem.  The results are reviewed by a Cardiologist and they will document the results. It should become available on MyChart. Typically the results are divided into percentiles based on other patients of the same demographic and age. So it will compare your risk to others similar to you. If you have a higher  score >99 or higher percentile >75%tile, it is recommended to consider Statin cholesterol therapy and or referral to Cardiologist. I will try to help explain your results and if we have questions we can contact the Cardiologist.  You will be contacted for scheduling. Usually it is done at any imaging facility through [facility], [facility] or [facility].  The cost is $99 flat fee total and it does not go through insurance, so no authorization is required.   Please schedule a Follow-up Appointment to: Return if symptoms worsen or fail to improve.  If you have any other questions or concerns, please feel free to call the office or send a message through MyChart. You may also schedule an earlier appointment if necessary.  Additionally, you may be receiving a survey about your experience at our office within a few days to 1 week by e-mail or mail. We value your feedback.  Saralyn Pilar, DO [facility], [place]

## 2024-03-27 LAB — CBC WITH DIFFERENTIAL/PLATELET
Absolute Lymphocytes: 2073 {cells}/uL (ref 850–3900)
Absolute Monocytes: 584 {cells}/uL (ref 200–950)
Basophils Absolute: 44 {cells}/uL (ref 0–200)
Basophils Relative: 0.6 %
Eosinophils Absolute: 248 {cells}/uL (ref 15–500)
Eosinophils Relative: 3.4 %
HCT: 42.8 % (ref 35.0–45.0)
Hemoglobin: 14.4 g/dL (ref 11.7–15.5)
MCH: 29.6 pg (ref 27.0–33.0)
MCHC: 33.6 g/dL (ref 32.0–36.0)
MCV: 87.9 fL (ref 80.0–100.0)
MPV: 9.7 fL (ref 7.5–12.5)
Monocytes Relative: 8 %
Neutro Abs: 4351 {cells}/uL (ref 1500–7800)
Neutrophils Relative %: 59.6 %
Platelets: 346 10*3/uL (ref 140–400)
RBC: 4.87 10*6/uL (ref 3.80–5.10)
RDW: 13 % (ref 11.0–15.0)
Total Lymphocyte: 28.4 %
WBC: 7.3 10*3/uL (ref 3.8–10.8)

## 2024-03-27 LAB — COMPLETE METABOLIC PANEL WITHOUT GFR
AG Ratio: 1.8 (calc) (ref 1.0–2.5)
ALT: 65 U/L — ABNORMAL HIGH (ref 6–29)
AST: 57 U/L — ABNORMAL HIGH (ref 10–35)
Albumin: 4.8 g/dL (ref 3.6–5.1)
Alkaline phosphatase (APISO): 63 U/L (ref 37–153)
BUN: 16 mg/dL (ref 7–25)
CO2: 27 mmol/L (ref 20–32)
Calcium: 10 mg/dL (ref 8.6–10.4)
Chloride: 105 mmol/L (ref 98–110)
Creat: 0.96 mg/dL (ref 0.60–1.00)
Globulin: 2.7 g/dL (ref 1.9–3.7)
Glucose, Bld: 96 mg/dL (ref 65–99)
Potassium: 4.8 mmol/L (ref 3.5–5.3)
Sodium: 141 mmol/L (ref 135–146)
Total Bilirubin: 0.5 mg/dL (ref 0.2–1.2)
Total Protein: 7.5 g/dL (ref 6.1–8.1)

## 2024-03-27 LAB — LIPID PANEL
Cholesterol: 181 mg/dL (ref <200)
HDL: 45 mg/dL — ABNORMAL LOW (ref >=50)
LDL Cholesterol (Calc): 110 mg/dL — ABNORMAL HIGH
Non-HDL Cholesterol (Calc): 136 mg/dL — ABNORMAL HIGH (ref <130)
Total CHOL/HDL Ratio: 4 (calc) (ref <5.0)
Triglycerides: 148 mg/dL (ref <150)

## 2024-03-27 LAB — HEMOGLOBIN A1C
Hgb A1c MFr Bld: 6 %{Hb} — ABNORMAL HIGH (ref <5.7)
Mean Plasma Glucose: 126 mg/dL
eAG (mmol/L): 7 mmol/L

## 2024-03-27 LAB — T4, FREE: Free T4: 1.1 ng/dL (ref 0.8–1.8)

## 2024-03-27 LAB — TSH: TSH: 3.44 m[IU]/L (ref 0.40–4.50)

## 2024-03-31 ENCOUNTER — Encounter: Payer: Self-pay | Admitting: Family Medicine

## 2024-04-01 ENCOUNTER — Ambulatory Visit
Admission: RE | Admit: 2024-04-01 | Discharge: 2024-04-01 | Disposition: A | Discharge status: Home / Self Care | Payer: Self-pay | Source: Ambulatory Visit | Attending: Family Medicine | Admitting: Family Medicine

## 2024-04-01 ENCOUNTER — Other Ambulatory Visit: Payer: Self-pay | Admitting: Family Medicine

## 2024-04-01 DIAGNOSIS — E782 Mixed hyperlipidemia: Secondary | ICD-10-CM

## 2024-04-01 DIAGNOSIS — I10 Essential (primary) hypertension: Secondary | ICD-10-CM

## 2024-04-01 DIAGNOSIS — Z0000 Encounter for general adult medical examination without abnormal findings: Secondary | ICD-10-CM

## 2024-04-01 DIAGNOSIS — R7303 Prediabetes: Secondary | ICD-10-CM

## 2024-04-01 DIAGNOSIS — E034 Atrophy of thyroid (acquired): Secondary | ICD-10-CM

## 2024-04-01 NOTE — Progress Notes (Signed)
 Appointments made patient notified

## 2024-04-02 ENCOUNTER — Encounter: Payer: Self-pay | Admitting: Family Medicine

## 2024-12-08 ENCOUNTER — Other Ambulatory Visit: Payer: Self-pay | Admitting: Family Medicine

## 2024-12-08 DIAGNOSIS — K219 Gastro-esophageal reflux disease without esophagitis: Secondary | ICD-10-CM

## 2024-12-09 NOTE — Telephone Encounter (Signed)
 Too soon for refill.  Requested Prescriptions  Pending Prescriptions Disp Refills   omeprazole  (PRILOSEC) 20 MG capsule [Pharmacy Med Name: OMEPRAZOLE  DR 20 MG CAP] 180 capsule 3    Sig: TAKE 1 CAPSULE BY MOUTH TWICE DAILY BEFORE A MEAL     Gastroenterology: Proton Pump Inhibitors Passed - 12/09/2024  4:28 PM      Passed - Valid encounter within last 12 months    Recent Outpatient Visits           8 months ago Annual physical exam   [facility] [place], [place], [place]

## 2025-04-01 ENCOUNTER — Other Ambulatory Visit

## 2025-04-11 ENCOUNTER — Encounter: Admitting: Family Medicine
